# Patient Record
Sex: Female | Born: 1970 | Race: Black or African American | Hispanic: No | Marital: Single | Smoking: Current every day smoker
Health system: Southern US, Community
[De-identification: ages and names within clinical notes are randomized; demographics above are authoritative.]

## PROBLEM LIST (undated history)

## (undated) DIAGNOSIS — K2980 Duodenitis without bleeding: Secondary | ICD-10-CM

## (undated) DIAGNOSIS — E785 Hyperlipidemia, unspecified: Secondary | ICD-10-CM

## (undated) DIAGNOSIS — D75839 Thrombocytosis, unspecified: Secondary | ICD-10-CM

## (undated) DIAGNOSIS — D649 Anemia, unspecified: Secondary | ICD-10-CM

## (undated) DIAGNOSIS — D473 Essential (hemorrhagic) thrombocythemia: Secondary | ICD-10-CM

## (undated) DIAGNOSIS — N6009 Solitary cyst of unspecified breast: Secondary | ICD-10-CM

## (undated) DIAGNOSIS — K219 Gastro-esophageal reflux disease without esophagitis: Secondary | ICD-10-CM

## (undated) DIAGNOSIS — T884XXA Failed or difficult intubation, initial encounter: Secondary | ICD-10-CM

## (undated) DIAGNOSIS — H9192 Unspecified hearing loss, left ear: Secondary | ICD-10-CM

## (undated) DIAGNOSIS — R011 Cardiac murmur, unspecified: Secondary | ICD-10-CM

## (undated) DIAGNOSIS — D259 Leiomyoma of uterus, unspecified: Secondary | ICD-10-CM

## (undated) HISTORY — DX: Anemia, unspecified: D64.9

## (undated) HISTORY — DX: Gastro-esophageal reflux disease without esophagitis: K21.9

## (undated) HISTORY — PX: BACK SURGERY: SHX140

## (undated) HISTORY — DX: Solitary cyst of unspecified breast: N60.09

## (undated) HISTORY — PX: BREAST BIOPSY: SHX20

---

## 2004-07-10 ENCOUNTER — Emergency Department: Payer: Self-pay | Admitting: Emergency Medicine

## 2008-11-13 ENCOUNTER — Emergency Department: Payer: Self-pay | Admitting: Emergency Medicine

## 2011-07-30 LAB — HM PAP SMEAR: HM PAP: NORMAL

## 2015-03-06 ENCOUNTER — Ambulatory Visit: Payer: Self-pay | Admitting: Family Medicine

## 2015-03-21 ENCOUNTER — Ambulatory Visit: Payer: Self-pay | Admitting: Family Medicine

## 2015-05-19 ENCOUNTER — Ambulatory Visit: Payer: Self-pay | Admitting: Physician Assistant

## 2015-07-07 ENCOUNTER — Ambulatory Visit (INDEPENDENT_AMBULATORY_CARE_PROVIDER_SITE_OTHER): Payer: BLUE CROSS/BLUE SHIELD | Admitting: Family Medicine

## 2015-07-07 ENCOUNTER — Encounter: Payer: Self-pay | Admitting: Family Medicine

## 2015-07-07 VITALS — BP 132/90 | HR 93 | Temp 98.4°F | Resp 16 | Ht 61.0 in | Wt 125.6 lb

## 2015-07-07 DIAGNOSIS — Z23 Encounter for immunization: Secondary | ICD-10-CM

## 2015-07-07 DIAGNOSIS — N6002 Solitary cyst of left breast: Secondary | ICD-10-CM

## 2015-07-07 DIAGNOSIS — Z7189 Other specified counseling: Secondary | ICD-10-CM

## 2015-07-07 DIAGNOSIS — Z8249 Family history of ischemic heart disease and other diseases of the circulatory system: Secondary | ICD-10-CM | POA: Diagnosis not present

## 2015-07-07 DIAGNOSIS — Z7689 Persons encountering health services in other specified circumstances: Secondary | ICD-10-CM

## 2015-07-07 DIAGNOSIS — K219 Gastro-esophageal reflux disease without esophagitis: Secondary | ICD-10-CM | POA: Insufficient documentation

## 2015-07-07 MED ORDER — OMEPRAZOLE 20 MG PO CPDR: 20.0000 mg | DELAYED_RELEASE_CAPSULE | Freq: Every day | ORAL | Status: DC

## 2015-07-07 NOTE — Patient Instructions (Signed)
We will schedule your breast ultrasoun and mammogram. We will give you a call.

## 2015-07-07 NOTE — Assessment & Plan Note (Signed)
L breast cyst- ordered US and mammogram for evaluation due to lump getting larger.

## 2015-07-07 NOTE — Assessment & Plan Note (Addendum)
Appears well controlled. No alarm symptoms. Continue omeprazole.  Check CBC. RTC 6 mos.

## 2015-07-07 NOTE — Progress Notes (Signed)
Subjective:    Patient ID: Tasha Simmons, female    DOB: 1970/10/25, 44 y.o.   MRN: [id number]  HPI: Tasha Simmons is a 44 y.o. female presenting on 07/07/2015 for Establish Care   HPI  Pt presents to establish care today. Previous care provider was none.  It has been several  Years since her last PCP visit. Records from previous provider will be requested and reviewed. Current medical problems include:  Acid Reflux: Controlled with omeprazole. No regurg, dysphagia, or blood in vomit.  Health maintenance:  Last pap: 2013- normal.  Mammogram: Last 5 years ago. Cyst in L breast- feels as though it has gotten bigger. No pain in the L breast.  TDAP: Unsure > 10 years ago.    Employeed a Engineer, water at [employer].  2 pregnancies and 2 live births- age 78, 61 Exercise: Roller skate, bikes, and walking.  Diet: Regular diet- trying to cut back on fried food.    Past Medical History  Diagnosis Date  . Acid reflux   . Benign breast cyst in female     5 years ago.    Social History   Social History  . Marital Status: Single    Spouse Name: N/A  . Number of Children: N/A  . Years of Education: N/A   Occupational History  . Not on file.   Social History Main Topics  . Smoking status: Current Every Day Smoker -- 0.50 packs/day  . Smokeless tobacco: Not on file  . Alcohol Use: No  . Drug Use: No  . Sexual Activity: Not on file   Other Topics Concern  . Not on file   Social History Narrative  . No narrative on file   Family History  Problem Relation Age of Onset  . Hyperlipidemia Mother   . Heart disease Father   . Hyperlipidemia Father   . Heart disease Maternal Grandmother    No current outpatient prescriptions on file prior to visit.   No current facility-administered medications on file prior to visit.    Review of Systems  Constitutional: Negative for fever and chills.  HENT: Negative.   Respiratory: Negative for cough, chest tightness and wheezing.     Cardiovascular: Negative for chest pain and leg swelling.  Gastrointestinal: Negative for nausea, vomiting, abdominal pain, diarrhea and constipation.  Endocrine: Negative.  Negative for cold intolerance, heat intolerance, polydipsia, polyphagia and polyuria.  Genitourinary: Negative for dysuria and difficulty urinating.  Musculoskeletal: Negative.   Neurological: Negative for dizziness, light-headedness and numbness.  Psychiatric/Behavioral: Negative.    Per HPI unless specifically indicated above     Objective:    BP 132/90 mmHg  Pulse 93  Temp(Src) 98.4 F (36.9 C) (Oral)  Resp 16  Ht 5\' 1"  (1.549 m)  Wt 125 lb 9.6 oz (56.972 kg)  BMI 23.74 kg/m2  LMP 06/22/2015  Wt Readings from Last 3 Encounters:  07/07/15 125 lb 9.6 oz (56.972 kg)    Physical Exam  Constitutional: She is oriented to person, place, and time. She appears well-developed and well-nourished.  HENT:  Head: Normocephalic and atraumatic.  Neck: Neck supple.  Cardiovascular: Normal rate, regular rhythm and normal heart sounds.  Exam reveals no gallop and no friction rub.   No murmur heard. Pulmonary/Chest: Effort normal and breath sounds normal. She has no wheezes. She exhibits no tenderness. Right breast exhibits no inverted nipple, no mass, no nipple discharge and no tenderness. Left breast exhibits mass. Left breast exhibits no inverted nipple, no  skin change and no tenderness.  L breast lump above the nipple about 11o'clock  Abdominal: Soft. Normal appearance and bowel sounds are normal. She exhibits no distension and no mass. There is no tenderness. There is no rebound and no guarding.  Musculoskeletal: Normal range of motion. She exhibits no edema or tenderness.  Lymphadenopathy:    She has no cervical adenopathy.  Neurological: She is alert and oriented to person, place, and time.  Skin: Skin is warm and dry.   Results for orders placed or performed in visit on 07/07/15  HM PAP SMEAR  Result Value Ref  Range   HM Pap smear normal       Assessment & Plan:   Problem List Items Addressed This Visit      Digestive   Gastroesophageal reflux disease without esophagitis    Appears well controlled. No alarm symptoms. Continue omeprazole.  Check CBC. RTC 6 mos.       Relevant Medications   omeprazole (PRILOSEC) 20 MG capsule   Other Relevant Orders   Comprehensive Metabolic Panel (CMET)   CBC with Differential     Other   Breast cyst    L breast cyst- ordered US and mammogram for evaluation due to lump getting larger.       Relevant Orders   MM Digital Diagnostic Bilat   US BREAST LTD UNI LEFT INC AXILLA   US BREAST LTD UNI RIGHT INC AXILLA    Other Visit Diagnoses    Need for influenza vaccination    -  Primary    Relevant Orders    Flu Vaccine QUAD 36+ mos PF IM (Fluarix & Fluzone Quad PF) (Completed)    Encounter to establish care        Due for [facility] this year.     Family history of heart attack        Check lipid panel to stratify risk factors.     Relevant Orders    Lipid Profile    Need for Tdap vaccination        Relevant Orders    Tdap vaccine greater than or equal to 7yo IM (Completed)       Meds ordered this encounter  Medications  . DISCONTD: omeprazole (PRILOSEC) 20 MG capsule    Sig:   . omeprazole (PRILOSEC) 20 MG capsule    Sig: Take 1 capsule (20 mg total) by mouth daily.    Dispense:  30 capsule    Refill:  11    Order Specific Question:  Supervising Provider    Answer:  Arlis Porta [phone]      Follow up plan: Return in about 6 months (around 01/05/2016) for Acid reflux. Marland Kitchen

## 2015-07-14 LAB — CBC WITH DIFFERENTIAL/PLATELET
BASOS ABS: 0.1 10*3/uL (ref 0.0–0.2)
Basos: 1 %
EOS (ABSOLUTE): 0.1 10*3/uL (ref 0.0–0.4)
Eos: 1 %
Hematocrit: 29.8 % — ABNORMAL LOW (ref 34.0–46.6)
Hemoglobin: 9.2 g/dL — ABNORMAL LOW (ref 11.1–15.9)
IMMATURE GRANULOCYTES: 0 %
Immature Grans (Abs): 0 10*3/uL (ref 0.0–0.1)
Lymphocytes Absolute: 3.6 10*3/uL — ABNORMAL HIGH (ref 0.7–3.1)
Lymphs: 32 %
MCH: 20.7 pg — ABNORMAL LOW (ref 26.6–33.0)
MCHC: 30.9 g/dL — ABNORMAL LOW (ref 31.5–35.7)
MCV: 67 fL — AB (ref 79–97)
MONOS ABS: 0.9 10*3/uL (ref 0.1–0.9)
Monocytes: 8 %
NEUTROS PCT: 58 %
Neutrophils Absolute: 6.6 10*3/uL (ref 1.4–7.0)
PLATELETS: 587 10*3/uL — AB (ref 150–379)
RBC: 4.45 x10E6/uL (ref 3.77–5.28)
RDW: 17 % — AB (ref 12.3–15.4)
WBC: 11.3 10*3/uL — AB (ref 3.4–10.8)

## 2015-07-15 LAB — COMPREHENSIVE METABOLIC PANEL
A/G RATIO: 1.4 (ref 1.1–2.5)
ALBUMIN: 4.4 g/dL (ref 3.5–5.5)
ALT: 11 IU/L (ref 0–32)
AST: 18 IU/L (ref 0–40)
Alkaline Phosphatase: 56 IU/L (ref 39–117)
BUN / CREAT RATIO: 15 (ref 9–23)
BUN: 8 mg/dL (ref 6–24)
CHLORIDE: 102 mmol/L (ref 96–106)
CO2: 17 mmol/L — ABNORMAL LOW (ref 18–29)
Calcium: 8.9 mg/dL (ref 8.7–10.2)
Creatinine, Ser: 0.55 mg/dL — ABNORMAL LOW (ref 0.57–1.00)
GFR calc non Af Amer: 115 mL/min/{1.73_m2} (ref >59)
GFR, EST AFRICAN AMERICAN: 133 mL/min/{1.73_m2} (ref >59)
Globulin, Total: 3.2 g/dL (ref 1.5–4.5)
Glucose: 69 mg/dL (ref 65–99)
POTASSIUM: 4.3 mmol/L (ref 3.5–5.2)
Sodium: 139 mmol/L (ref 134–144)
TOTAL PROTEIN: 7.6 g/dL (ref 6.0–8.5)

## 2015-07-15 LAB — LIPID PANEL
Chol/HDL Ratio: 3.3 ratio units (ref 0.0–4.4)
Cholesterol, Total: 188 mg/dL (ref 100–199)
HDL: 57 mg/dL (ref >39)
LDL Calculated: 109 mg/dL — ABNORMAL HIGH (ref 0–99)
TRIGLYCERIDES: 111 mg/dL (ref 0–149)
VLDL Cholesterol Cal: 22 mg/dL (ref 5–40)

## 2015-07-17 ENCOUNTER — Telehealth: Payer: Self-pay | Admitting: Family Medicine

## 2015-07-17 ENCOUNTER — Other Ambulatory Visit: Payer: Self-pay | Admitting: Family Medicine

## 2015-07-17 DIAGNOSIS — D509 Iron deficiency anemia, unspecified: Secondary | ICD-10-CM

## 2015-07-17 LAB — IRON AND TIBC
IRON SATURATION: 2 % — AB (ref 15–55)
IRON: 10 ug/dL — AB (ref 27–159)
TIBC: 450 ug/dL (ref 250–450)
UIBC: 440 ug/dL — ABNORMAL HIGH (ref 131–425)

## 2015-07-17 LAB — SPECIMEN STATUS REPORT

## 2015-07-17 LAB — FERRITIN: FERRITIN: 7 ng/mL — AB (ref 15–150)

## 2015-07-17 NOTE — Telephone Encounter (Signed)
Called pt to ensure she saw my chart message about lab results. Pt will call back if she has questions.

## 2015-07-25 ENCOUNTER — Other Ambulatory Visit: Payer: Self-pay

## 2015-07-25 ENCOUNTER — Ambulatory Visit: Payer: Self-pay

## 2015-09-01 ENCOUNTER — Ambulatory Visit
Admission: RE | Admit: 2015-09-01 | Discharge: 2015-09-01 | Disposition: A | Discharge status: Home / Self Care | Payer: BLUE CROSS/BLUE SHIELD | Source: Ambulatory Visit | Attending: Family Medicine | Admitting: Family Medicine

## 2015-09-01 ENCOUNTER — Other Ambulatory Visit: Payer: Self-pay | Admitting: Family Medicine

## 2015-09-01 DIAGNOSIS — R921 Mammographic calcification found on diagnostic imaging of breast: Secondary | ICD-10-CM | POA: Diagnosis not present

## 2015-09-01 DIAGNOSIS — N63 Unspecified lump in breast: Secondary | ICD-10-CM | POA: Diagnosis not present

## 2015-09-01 DIAGNOSIS — N6002 Solitary cyst of left breast: Secondary | ICD-10-CM

## 2016-02-12 ENCOUNTER — Ambulatory Visit (INDEPENDENT_AMBULATORY_CARE_PROVIDER_SITE_OTHER): Payer: BLUE CROSS/BLUE SHIELD | Admitting: Family Medicine

## 2016-02-12 ENCOUNTER — Encounter: Payer: Self-pay | Admitting: Family Medicine

## 2016-02-12 VITALS — BP 127/82 | HR 76 | Temp 98.4°F | Resp 16 | Ht 61.0 in | Wt 115.0 lb

## 2016-02-12 DIAGNOSIS — D509 Iron deficiency anemia, unspecified: Secondary | ICD-10-CM | POA: Diagnosis not present

## 2016-02-12 DIAGNOSIS — Z0000 Encounter for general adult medical examination without abnormal findings: Secondary | ICD-10-CM

## 2016-02-12 DIAGNOSIS — N6002 Solitary cyst of left breast: Secondary | ICD-10-CM | POA: Diagnosis not present

## 2016-02-12 DIAGNOSIS — K219 Gastro-esophageal reflux disease without esophagitis: Secondary | ICD-10-CM | POA: Diagnosis not present

## 2016-02-12 DIAGNOSIS — R6881 Early satiety: Secondary | ICD-10-CM

## 2016-02-12 DIAGNOSIS — N63 Unspecified lump in unspecified breast: Secondary | ICD-10-CM | POA: Insufficient documentation

## 2016-02-12 LAB — CBC WITH DIFFERENTIAL/PLATELET
BASOS PCT: 1 %
Basophils Absolute: 90 cells/uL (ref 0–200)
EOS PCT: 2 %
Eosinophils Absolute: 180 cells/uL (ref 15–500)
HCT: 41.3 % (ref 35.0–45.0)
Hemoglobin: 13.5 g/dL (ref 11.7–15.5)
LYMPHS PCT: 30 %
Lymphs Abs: 2700 cells/uL (ref 850–3900)
MCH: 29.3 pg (ref 27.0–33.0)
MCHC: 32.7 g/dL (ref 32.0–36.0)
MCV: 89.6 fL (ref 80.0–100.0)
MONOS PCT: 8 %
MPV: 8.9 fL (ref 7.5–12.5)
Monocytes Absolute: 720 cells/uL (ref 200–950)
NEUTROS ABS: 5310 {cells}/uL (ref 1500–7800)
Neutrophils Relative %: 59 %
PLATELETS: 497 10*3/uL — AB (ref 140–400)
RBC: 4.61 MIL/uL (ref 3.80–5.10)
RDW: 13.5 % (ref 11.0–15.0)
WBC: 9 10*3/uL (ref 3.8–10.8)

## 2016-02-12 LAB — FERRITIN: Ferritin: 25 ng/mL (ref 10–232)

## 2016-02-12 LAB — RETICULOCYTES
ABS RETIC: 55320 {cells}/uL (ref 20000–80000)
RBC.: 4.61 MIL/uL (ref 3.80–5.10)
Retic Ct Pct: 1.2 %

## 2016-02-12 MED ORDER — RANITIDINE HCL 150 MG PO CAPS: 150.0000 mg | ORAL_CAPSULE | Freq: Two times a day (BID) | ORAL | Status: DC

## 2016-02-12 NOTE — Assessment & Plan Note (Addendum)
Discussed long-term PPI with patient. Due to daily symptoms will try an H2 blocker BID to help with symptoms. Recheck 1 mos. Plan for GI referral with continued symptoms.

## 2016-02-12 NOTE — Assessment & Plan Note (Signed)
Needs 6 mos mammogram and L breast US ordered.

## 2016-02-12 NOTE — Assessment & Plan Note (Signed)
Recheck iron studies. Plan for hematology referral to discuss infusions if iron remains low.

## 2016-02-12 NOTE — Patient Instructions (Signed)
Health Maintenance, Female Adopting a healthy lifestyle and getting preventive care can go a long way to promote health and wellness. Talk with your health care provider about what schedule of regular examinations is right for you. This is a good chance for you to check in with your provider about disease prevention and staying healthy. In between checkups, there are plenty of things you can do on your own. Experts have done a lot of research about which lifestyle changes and preventive measures are most likely to keep you healthy. Ask your health care provider for more information. WEIGHT AND DIET  Eat a healthy diet  Be sure to include plenty of vegetables, fruits, low-fat dairy products, and lean protein.  Do not eat a lot of foods high in solid fats, added sugars, or salt.  Get regular exercise. This is one of the most important things you can do for your health.  Most adults should exercise for at least 150 minutes each week. The exercise should increase your heart rate and make you sweat (moderate-intensity exercise).  Most adults should also do strengthening exercises at least twice a week. This is in addition to the moderate-intensity exercise.  Maintain a healthy weight  Body mass index (BMI) is a measurement that can be used to identify possible weight problems. It estimates body fat based on height and weight. Your health care provider can help determine your BMI and help you achieve or maintain a healthy weight.  For females 20 years of age and older:   A BMI below 18.5 is considered underweight.  A BMI of 18.5 to 24.9 is normal.  A BMI of 25 to 29.9 is considered overweight.  A BMI of 30 and above is considered obese.  Watch levels of cholesterol and blood lipids  You should start having your blood tested for lipids and cholesterol at 44 years of age, then have this test every 5 years.  You may need to have your cholesterol levels checked more often if:  Your lipid  or cholesterol levels are high.  You are older than 44 years of age.  You are at high risk for heart disease.  CANCER SCREENING   Lung Cancer  Lung cancer screening is recommended for adults 55-80 years old who are at high risk for lung cancer because of a history of smoking.  A yearly low-dose CT scan of the lungs is recommended for people who:  Currently smoke.  Have quit within the past 15 years.  Have at least a 30-pack-year history of smoking. A pack year is smoking an average of one pack of cigarettes a day for 1 year.  Yearly screening should continue until it has been 15 years since you quit.  Yearly screening should stop if you develop a health problem that would prevent you from having lung cancer treatment.  Breast Cancer  Practice breast self-awareness. This means understanding how your breasts normally appear and feel.  It also means doing regular breast self-exams. Let your health care provider know about any changes, no matter how small.  If you are in your 20s or 30s, you should have a clinical breast exam (CBE) by a health care provider every 1-3 years as part of a regular health exam.  If you are 40 or older, have a CBE every year. Also consider having a breast X-ray (mammogram) every year.  If you have a family history of breast cancer, talk to your health care provider about genetic screening.  If you   are at high risk for breast cancer, talk to your health care provider about having an MRI and a mammogram every year.  Breast cancer gene (BRCA) assessment is recommended for women who have family members with BRCA-related cancers. BRCA-related cancers include:  Breast.  Ovarian.  Tubal.  Peritoneal cancers.  Results of the assessment will determine the need for genetic counseling and BRCA1 and BRCA2 testing. Cervical Cancer Your health care provider may recommend that you be screened regularly for cancer of the pelvic organs (ovaries, uterus, and  vagina). This screening involves a pelvic examination, including checking for microscopic changes to the surface of your cervix (Pap test). You may be encouraged to have this screening done every 3 years, beginning at age 21.  For women ages 30-65, health care providers may recommend pelvic exams and Pap testing every 3 years, or they may recommend the Pap and pelvic exam, combined with testing for human papilloma virus (HPV), every 5 years. Some types of HPV increase your risk of cervical cancer. Testing for HPV may also be done on women of any age with unclear Pap test results.  Other health care providers may not recommend any screening for nonpregnant women who are considered low risk for pelvic cancer and who do not have symptoms. Ask your health care provider if a screening pelvic exam is right for you.  If you have had past treatment for cervical cancer or a condition that could lead to cancer, you need Pap tests and screening for cancer for at least 20 years after your treatment. If Pap tests have been discontinued, your risk factors (such as having a new sexual partner) need to be reassessed to determine if screening should resume. Some women have medical problems that increase the chance of getting cervical cancer. In these cases, your health care provider may recommend more frequent screening and Pap tests. Colorectal Cancer  This type of cancer can be detected and often prevented.  Routine colorectal cancer screening usually begins at 44 years of age and continues through 44 years of age.  Your health care provider may recommend screening at an earlier age if you have risk factors for colon cancer.  Your health care provider may also recommend using home test kits to check for hidden blood in the stool.  A small camera at the end of a tube can be used to examine your colon directly (sigmoidoscopy or colonoscopy). This is done to check for the earliest forms of colorectal  cancer.  Routine screening usually begins at age 50.  Direct examination of the colon should be repeated every 5-10 years through 44 years of age. However, you may need to be screened more often if early forms of precancerous polyps or small growths are found. Skin Cancer  Check your skin from head to toe regularly.  Tell your health care provider about any new moles or changes in moles, especially if there is a change in a mole's shape or color.  Also tell your health care provider if you have a mole that is larger than the size of a pencil eraser.  Always use sunscreen. Apply sunscreen liberally and repeatedly throughout the day.  Protect yourself by wearing long sleeves, pants, a wide-brimmed hat, and sunglasses whenever you are outside. HEART DISEASE, DIABETES, AND HIGH BLOOD PRESSURE   High blood pressure causes heart disease and increases the risk of stroke. High blood pressure is more likely to develop in:  People who have blood pressure in the high end   of the normal range (130-139/85-89 mm Hg).  People who are overweight or obese.  People who are African American.  If you are 38-23 years of age, have your blood pressure checked every 3-5 years. If you are 61 years of age or older, have your blood pressure checked every year. You should have your blood pressure measured twice--once when you are at a hospital or clinic, and once when you are not at a hospital or clinic. Record the average of the two measurements. To check your blood pressure when you are not at a hospital or clinic, you can use:  An automated blood pressure machine at a pharmacy.  A home blood pressure monitor.  If you are between 45 years and 39 years old, ask your health care provider if you should take aspirin to prevent strokes.  Have regular diabetes screenings. This involves taking a blood sample to check your fasting blood sugar level.  If you are at a normal weight and have a low risk for diabetes,  have this test once every three years after 44 years of age.  If you are overweight and have a high risk for diabetes, consider being tested at a younger age or more often. PREVENTING INFECTION  Hepatitis B  If you have a higher risk for hepatitis B, you should be screened for this virus. You are considered at high risk for hepatitis B if:  You were born in a country where hepatitis B is common. Ask your health care provider which countries are considered high risk.  Your parents were born in a high-risk country, and you have not been immunized against hepatitis B (hepatitis B vaccine).  You have HIV or AIDS.  You use needles to inject street drugs.  You live with someone who has hepatitis B.  You have had sex with someone who has hepatitis B.  You get hemodialysis treatment.  You take certain medicines for conditions, including cancer, organ transplantation, and autoimmune conditions. Hepatitis C  Blood testing is recommended for:  Everyone born from [birth year] through 1964.  Anyone with known risk factors for hepatitis C. Sexually transmitted infections (STIs)  You should be screened for sexually transmitted infections (STIs) including gonorrhea and chlamydia if:  You are sexually active and are younger than 44 years of age.  You are older than 44 years of age and your health care provider tells you that you are at risk for this type of infection.  Your sexual activity has changed since you were last screened and you are at an increased risk for chlamydia or gonorrhea. Ask your health care provider if you are at risk.  If you do not have HIV, but are at risk, it may be recommended that you take a prescription medicine daily to prevent HIV infection. This is called pre-exposure prophylaxis (PrEP). You are considered at risk if:  You are sexually active and do not regularly use condoms or know the HIV status of your partner(s).  You take drugs by injection.  You are sexually  active with a partner who has HIV. Talk with your health care provider about whether you are at high risk of being infected with HIV. If you choose to begin PrEP, you should first be tested for HIV. You should then be tested every 3 months for as long as you are taking PrEP.  PREGNANCY   If you are premenopausal and you may become pregnant, ask your health care provider about preconception counseling.  If you may  become pregnant, take 400 to 800 micrograms (mcg) of folic acid every day.  If you want to prevent pregnancy, talk to your health care provider about birth control (contraception). OSTEOPOROSIS AND MENOPAUSE   Osteoporosis is a disease in which the bones lose minerals and strength with aging. This can result in serious bone fractures. Your risk for osteoporosis can be identified using a bone density scan.  If you are 61 years of age or older, or if you are at risk for osteoporosis and fractures, ask your health care provider if you should be screened.  Ask your health care provider whether you should take a calcium or vitamin D supplement to lower your risk for osteoporosis.  Menopause may have certain physical symptoms and risks.  Hormone replacement therapy may reduce some of these symptoms and risks. Talk to your health care provider about whether hormone replacement therapy is right for you.  HOME CARE INSTRUCTIONS   Schedule regular health, dental, and eye exams.  Stay current with your immunizations.   Do not use any tobacco products including cigarettes, chewing tobacco, or electronic cigarettes.  If you are pregnant, do not drink alcohol.  If you are breastfeeding, limit how much and how often you drink alcohol.  Limit alcohol intake to no more than 1 drink per day for nonpregnant women. One drink equals 12 ounces of beer, 5 ounces of wine, or 1 ounces of hard liquor.  Do not use street drugs.  Do not share needles.  Ask your health care provider for help if  you need support or information about quitting drugs.  Tell your health care provider if you often feel depressed.  Tell your health care provider if you have ever been abused or do not feel safe at home.   This information is not intended to replace advice given to you by your health care provider. Make sure you discuss any questions you have with your health care provider.   Document Released: 01/28/2011 Document Revised: 08/05/2014 Document Reviewed: 06/16/2013 Elsevier Interactive Patient Education [employer].

## 2016-02-12 NOTE — Progress Notes (Signed)
Subjective:    Patient ID: Tasha Simmons, female    DOB: [birth year], 45 y.o.   MRN: [id number]  HPI: Tasha Simmons is a 45 y.o. female presenting on 02/12/2016 for Annual Exam   HPI  Pt presents for CPE. She in her menstrual cycle today and would like to decline pap smear and bimanual exam. Period started last Wednesday (7/12). Currently spotting.  Had mammo- needs 6 mos follow-up for L breast benign mass.  GERD- not taking prilosec every day. Takes every 2 weeks or so. Has cut back on eating- feels like the food sits on her stomach. Early fullness. Started when she stopped taking prilosec. No blood in stool or vomit. No regurg. No dysphagia.  Iron deficiency- taking iron once daily. Tolerating well.    Past Medical History  Diagnosis Date  . Acid reflux   . Benign breast cyst in female     5 years ago.     Current Outpatient Prescriptions on File Prior to Visit  Medication Sig  . omeprazole (PRILOSEC) 20 MG capsule Take 1 capsule (20 mg total) by mouth daily.   No current facility-administered medications on file prior to visit.    Review of Systems  Constitutional: Negative for fever and chills.  HENT: Negative.   Respiratory: Negative for cough, chest tightness and wheezing.   Cardiovascular: Negative for chest pain and leg swelling.  Gastrointestinal: Negative for nausea, vomiting, abdominal pain, diarrhea and constipation.  Endocrine: Negative.  Negative for cold intolerance, heat intolerance, polydipsia, polyphagia and polyuria.  Genitourinary: Negative for dysuria and difficulty urinating.  Musculoskeletal: Negative.   Neurological: Negative for dizziness, light-headedness and numbness.  Psychiatric/Behavioral: Negative.  Negative for suicidal ideas and dysphoric mood. The patient is not nervous/anxious.    Per HPI unless specifically indicated above     Objective:    BP 127/82 mmHg  Pulse 76  Temp(Src) 98.4 F (36.9 C) (Oral)  Resp 16  Ht 5\' 1"  (1.549 m)   Wt 115 lb (52.164 kg)  BMI 21.74 kg/m2  LMP 02/07/2016  Wt Readings from Last 3 Encounters:  02/12/16 115 lb (52.164 kg)  07/07/15 125 lb 9.6 oz (56.972 kg)    Depression screen [facility] 2/9 02/12/2016 07/07/2015  Decreased Interest 0 0  Down, Depressed, Hopeless 0 0  PHQ - 2 Score 0 0     Physical Exam  Constitutional: She is oriented to person, place, and time. She appears well-developed and well-nourished.  HENT:  Head: Normocephalic and atraumatic.  Neck: Neck supple.  Cardiovascular: Normal rate, regular rhythm and normal heart sounds.  Exam reveals no gallop and no friction rub.   No murmur heard. Pulmonary/Chest: Effort normal and breath sounds normal. She has no wheezes. She exhibits no tenderness.  Abdominal: Soft. Normal appearance and bowel sounds are normal. She exhibits no distension and no mass. There is no tenderness. There is no rebound and no guarding.  Musculoskeletal: Normal range of motion. She exhibits no edema or tenderness.  Lymphadenopathy:    She has no cervical adenopathy.  Neurological: She is alert and oriented to person, place, and time.  Skin: Skin is warm and dry.  Psychiatric: She has a normal mood and affect. Her behavior is normal. Judgment and thought content normal.   Results for orders placed or performed in visit on 07/07/15  HM PAP SMEAR  Result Value Ref Range   HM Pap smear normal   Lipid Profile  Result Value Ref Range   Cholesterol, Total  188 100 - 199 mg/dL   Triglycerides 111 0 - 149 mg/dL   HDL 57 >39 mg/dL   VLDL Cholesterol Cal 22 5 - 40 mg/dL   LDL Calculated 109 (H) 0 - 99 mg/dL   Chol/HDL Ratio 3.3 0.0 - 4.4 ratio units  Comprehensive Metabolic Panel (CMET)  Result Value Ref Range   Glucose 69 65 - 99 mg/dL   BUN 8 6 - 24 mg/dL   Creatinine, Ser 0.55 (L) 0.57 - 1.00 mg/dL   GFR calc non Af Amer 115 >59 mL/min/1.73   GFR calc Af Amer 133 >59 mL/min/1.73   BUN/Creatinine Ratio 15 9 - 23   Sodium 139 134 - 144 mmol/L    Potassium 4.3 3.5 - 5.2 mmol/L   Chloride 102 96 - 106 mmol/L   CO2 17 (L) 18 - 29 mmol/L   Calcium 8.9 8.7 - 10.2 mg/dL   Total Protein 7.6 6.0 - 8.5 g/dL   Albumin 4.4 3.5 - 5.5 g/dL   Globulin, Total 3.2 1.5 - 4.5 g/dL   Albumin/Globulin Ratio 1.4 1.1 - 2.5   Bilirubin Total <0.2 0.0 - 1.2 mg/dL   Alkaline Phosphatase 56 39 - 117 IU/L   AST 18 0 - 40 IU/L   ALT 11 0 - 32 IU/L  CBC with Differential  Result Value Ref Range   WBC 11.3 (H) 3.4 - 10.8 x10E3/uL   RBC 4.45 3.77 - 5.28 x10E6/uL   Hemoglobin 9.2 (L) 11.1 - 15.9 g/dL   Hematocrit 29.8 (L) 34.0 - 46.6 %   MCV 67 (L) 79 - 97 fL   MCH 20.7 (L) 26.6 - 33.0 pg   MCHC 30.9 (L) 31.5 - 35.7 g/dL   RDW 17.0 (H) 12.3 - 15.4 %   Platelets 587 (H) 150 - 379 x10E3/uL   Neutrophils 58 %   Lymphs 32 %   Monocytes 8 %   Eos 1 %   Basos 1 %   Neutrophils Absolute 6.6 1.4 - 7.0 x10E3/uL   Lymphocytes Absolute 3.6 (H) 0.7 - 3.1 x10E3/uL   Monocytes Absolute 0.9 0.1 - 0.9 x10E3/uL   EOS (ABSOLUTE) 0.1 0.0 - 0.4 x10E3/uL   Basophils Absolute 0.1 0.0 - 0.2 x10E3/uL   Immature Granulocytes 0 %   Immature Grans (Abs) 0.0 0.0 - 0.1 x10E3/uL  Iron and TIBC  Result Value Ref Range   Total Iron Binding Capacity 450 250 - 450 ug/dL   UIBC 440 (H) 131 - 425 ug/dL   Iron 10 (L) 27 - 159 ug/dL   Iron Saturation 2 (LL) 15 - 55 %  Ferritin  Result Value Ref Range   Ferritin 7 (L) 15 - 150 ng/mL  Specimen status report  Result Value Ref Range   specimen status report Comment       Assessment & Plan:   Problem List Items Addressed This Visit      Digestive   Gastroesophageal reflux disease without esophagitis    Discussed long-term PPI with patient. Due to daily symptoms will try an H2 blocker BID to help with symptoms. Recheck 1 mos. Plan for GI referral with continued symptoms.        Relevant Medications   ranitidine (ZANTAC) 150 MG capsule     Other   Breast cyst    Needs 6 mos mammogram and L breast US ordered.        Relevant Orders   US BREAST LTD UNI LEFT INC AXILLA   Iron deficiency anemia  Recheck iron studies. Plan for hematology referral to discuss infusions if iron remains low.       Relevant Medications   ferrous sulfate 325 (65 FE) MG tablet   Other Relevant Orders   CBC with Differential/Platelet   Iron Binding Cap (TIBC)   Ferritin   Retic    Other Visit Diagnoses    Well woman exam without gynecological exam    -  Primary    Relevant Orders    BASIC METABOLIC PANEL WITH GFR    Early satiety        Relevant Orders    Amylase    Lipase       Meds ordered this encounter  Medications  . ferrous sulfate 325 (65 FE) MG tablet    Sig: Take 325 mg by mouth 2 (two) times daily with a meal.  . ranitidine (ZANTAC) 150 MG capsule    Sig: Take 1 capsule (150 mg total) by mouth 2 (two) times daily.    Dispense:  60 capsule    Refill:  11    Order Specific Question:  Supervising Provider    Answer:  Arlis Porta [phone]      Follow up plan: Return in about 4 weeks (around 03/11/2016) for GERD and pap smear. 3-5 weeks. Marland Kitchen

## 2016-02-13 LAB — IRON AND TIBC
%SAT: 62 % — AB (ref 11–50)
IRON: 212 ug/dL — AB (ref 40–190)
TIBC: 344 ug/dL (ref 250–450)
UIBC: 132 ug/dL (ref 125–400)

## 2016-02-13 LAB — BASIC METABOLIC PANEL WITH GFR
BUN: 7 mg/dL (ref 7–25)
CHLORIDE: 107 mmol/L (ref 98–110)
CO2: 23 mmol/L (ref 20–31)
Calcium: 9.4 mg/dL (ref 8.6–10.2)
Creat: 0.75 mg/dL (ref 0.50–1.10)
Glucose, Bld: 82 mg/dL (ref 65–99)
POTASSIUM: 4.5 mmol/L (ref 3.5–5.3)
Sodium: 138 mmol/L (ref 135–146)

## 2016-02-13 LAB — LIPASE: LIPASE: 14 U/L (ref 7–60)

## 2016-02-13 LAB — AMYLASE: AMYLASE: 25 U/L (ref 0–105)

## 2016-03-12 ENCOUNTER — Ambulatory Visit: Payer: BLUE CROSS/BLUE SHIELD | Admitting: Family Medicine

## 2016-04-08 ENCOUNTER — Other Ambulatory Visit: Payer: Self-pay | Admitting: Family Medicine

## 2016-04-08 ENCOUNTER — Telehealth: Payer: Self-pay | Admitting: Gastroenterology

## 2016-04-08 ENCOUNTER — Encounter: Payer: Self-pay | Admitting: Family Medicine

## 2016-04-08 ENCOUNTER — Ambulatory Visit (INDEPENDENT_AMBULATORY_CARE_PROVIDER_SITE_OTHER): Payer: BLUE CROSS/BLUE SHIELD | Admitting: Family Medicine

## 2016-04-08 VITALS — BP 124/85 | HR 86 | Temp 98.1°F | Resp 16 | Ht 61.0 in | Wt 113.6 lb

## 2016-04-08 DIAGNOSIS — Z23 Encounter for immunization: Secondary | ICD-10-CM

## 2016-04-08 DIAGNOSIS — Z124 Encounter for screening for malignant neoplasm of cervix: Secondary | ICD-10-CM | POA: Diagnosis not present

## 2016-04-08 DIAGNOSIS — R87810 Cervical high risk human papillomavirus (HPV) DNA test positive: Secondary | ICD-10-CM | POA: Diagnosis not present

## 2016-04-08 DIAGNOSIS — R634 Abnormal weight loss: Secondary | ICD-10-CM | POA: Diagnosis not present

## 2016-04-08 DIAGNOSIS — K219 Gastro-esophageal reflux disease without esophagitis: Secondary | ICD-10-CM

## 2016-04-08 LAB — CBC WITH DIFFERENTIAL/PLATELET
BASOS ABS: 95 {cells}/uL (ref 0–200)
BASOS PCT: 1 %
EOS PCT: 2 %
Eosinophils Absolute: 190 cells/uL (ref 15–500)
HCT: 40.7 % (ref 35.0–45.0)
HEMOGLOBIN: 13.4 g/dL (ref 11.7–15.5)
LYMPHS ABS: 2755 {cells}/uL (ref 850–3900)
Lymphocytes Relative: 29 %
MCH: 29.1 pg (ref 27.0–33.0)
MCHC: 32.9 g/dL (ref 32.0–36.0)
MCV: 88.3 fL (ref 80.0–100.0)
MONOS PCT: 8 %
MPV: 9.1 fL (ref 7.5–12.5)
Monocytes Absolute: 760 cells/uL (ref 200–950)
NEUTROS ABS: 5700 {cells}/uL (ref 1500–7800)
Neutrophils Relative %: 60 %
PLATELETS: 532 10*3/uL — AB (ref 140–400)
RBC: 4.61 MIL/uL (ref 3.80–5.10)
RDW: 13.5 % (ref 11.0–15.0)
WBC: 9.5 10*3/uL (ref 3.8–10.8)

## 2016-04-08 LAB — BASIC METABOLIC PANEL WITH GFR
BUN: 5 mg/dL — AB (ref 7–25)
CHLORIDE: 105 mmol/L (ref 98–110)
CO2: 25 mmol/L (ref 20–31)
Calcium: 9.3 mg/dL (ref 8.6–10.2)
Creat: 0.59 mg/dL (ref 0.50–1.10)
GFR, Est African American: >89 mL/min (ref >=60)
GFR, Est Non African American: >89 mL/min (ref >=60)
Glucose, Bld: 82 mg/dL (ref 65–99)
POTASSIUM: 4.4 mmol/L (ref 3.5–5.3)
Sodium: 136 mmol/L (ref 135–146)

## 2016-04-08 MED ORDER — PANTOPRAZOLE SODIUM 40 MG PO TBEC: 40.0000 mg | DELAYED_RELEASE_TABLET | Freq: Every day | ORAL | 11 refills | Status: DC

## 2016-04-08 NOTE — Telephone Encounter (Signed)
Patient is returning your call regarding an appointment. EGD?

## 2016-04-08 NOTE — Telephone Encounter (Signed)
Patient wants to hold off on the procedure until she comes in for her appointment

## 2016-04-08 NOTE — Telephone Encounter (Signed)
Patient has been referred from [facility] to GI for GERD and weight loss. Inability to tolerate Omeprazole and difficulty eating due to GERD. She has tried Omeprazole and Ranitidine with no success. I have made her an appointment with Dr Allen Norris on Tuesday October 17th. Could you please triage to see if patient is a candidate for EGD prior to appointment? Thank you.

## 2016-04-08 NOTE — Progress Notes (Signed)
Subjective:    Patient ID: Tasha Simmons, female    DOB: 07-31-1970, 45 y.o.   MRN: [id number]  HPI: Tasha Simmons is a 45 y.o. female presenting on 04/08/2016 for Gastroesophageal Reflux (pt was RX Renitidine not helping)   HPI  Pt presents for follow-up of GERD. When she takes omeprazole- she feels like it makes her heart race. Has been taking it every other. Ranitidine works for a few hours but not quite. When it leaves her system makes her feel bloated. Has cut down on what she was eating due to bloating and feel uncomfortable. She has lost 8lbs since December.  Needs pap today. Unable to do at her [facility] due to period.  LMP 9/1.   Past Medical History:  Diagnosis Date  . Acid reflux   . Benign breast cyst in female    5 years ago.     Current Outpatient Prescriptions on File Prior to Visit  Medication Sig  . ranitidine (ZANTAC) 150 MG capsule Take 1 capsule (150 mg total) by mouth 2 (two) times daily.   No current facility-administered medications on file prior to visit.     Review of Systems Per HPI unless specifically indicated above     Objective:    BP 124/85 (BP Location: Right Arm, Patient Position: Sitting, Cuff Size: Normal)   Pulse 86   Temp 98.1 F (36.7 C) (Oral)   Resp 16   Ht '5\' 1"'$  (1.549 m)   Wt 113 lb 9.6 oz (51.5 kg)   LMP 02/27/2016   BMI 21.46 kg/m   Wt Readings from Last 3 Encounters:  04/08/16 113 lb 9.6 oz (51.5 kg)  02/12/16 115 lb (52.2 kg)  07/07/15 125 lb 9.6 oz (57 kg)    Physical Exam  Constitutional: She is oriented to person, place, and time. She appears well-developed and well-nourished.  HENT:  Head: Normocephalic and atraumatic.  Neck: Neck supple.  Cardiovascular: Normal rate, regular rhythm and normal heart sounds.  Exam reveals no gallop and no friction rub.   No murmur heard. Pulmonary/Chest: Effort normal and breath sounds normal. She has no wheezes. She exhibits no tenderness.  Abdominal: Soft. Normal appearance  and bowel sounds are normal. She exhibits no distension and no mass. There is tenderness in the epigastric area. There is no rebound and no guarding.  Genitourinary: Vagina normal and uterus normal. There is no tenderness, lesion or injury on the right labia. There is no tenderness, lesion or injury on the left labia. Uterus is not tender. Cervix exhibits no motion tenderness, no discharge and no friability. Right adnexum displays no mass, no tenderness and no fullness. Left adnexum displays no mass, no tenderness and no fullness. No erythema, tenderness or bleeding in the vagina.  Musculoskeletal: Normal range of motion. She exhibits no edema or tenderness.  Lymphadenopathy:    She has no cervical adenopathy.  Neurological: She is alert and oriented to person, place, and time.  Skin: Skin is warm and dry.   Results for orders placed or performed in visit on 02/12/16  CBC with Differential/Platelet  Result Value Ref Range   WBC 9.0 3.8 - 10.8 K/uL   RBC 4.61 3.80 - 5.10 MIL/uL   Hemoglobin 13.5 11.7 - 15.5 g/dL   HCT 41.3 35.0 - 45.0 %   MCV 89.6 80.0 - 100.0 fL   MCH 29.3 27.0 - 33.0 pg   MCHC 32.7 32.0 - 36.0 g/dL   RDW 13.5 11.0 - 15.0 %  Platelets 497 (H) 140 - 400 K/uL   MPV 8.9 7.5 - 12.5 fL   Neutro Abs 5,310 1,500 - 7,800 cells/uL   Lymphs Abs 2,700 850 - 3,900 cells/uL   Monocytes Absolute 720 200 - 950 cells/uL   Eosinophils Absolute 180 15 - 500 cells/uL   Basophils Absolute 90 0 - 200 cells/uL   Neutrophils Relative % 59 %   Lymphocytes Relative 30 %   Monocytes Relative 8 %   Eosinophils Relative 2 %   Basophils Relative 1 %   Smear Review Criteria for review not met   Iron Binding Cap (TIBC)  Result Value Ref Range   Iron 212 (H) 40 - 190 ug/dL   UIBC 132 125 - 400 ug/dL   TIBC 344 250 - 450 ug/dL   %SAT 62 (H) 11 - 50 %  Ferritin  Result Value Ref Range   Ferritin 25 10 - 232 ng/mL  Retic  Result Value Ref Range   Retic Ct Pct 1.2 %   RBC. 4.61 3.80 - 5.10  MIL/uL   ABS Retic 55,320 20,000 - 80,000 cells/uL  BASIC METABOLIC PANEL WITH GFR  Result Value Ref Range   Sodium 138 135 - 146 mmol/L   Potassium 4.5 3.5 - 5.3 mmol/L   Chloride 107 98 - 110 mmol/L   CO2 23 20 - 31 mmol/L   Glucose, Bld 82 65 - 99 mg/dL   BUN 7 7 - 25 mg/dL   Creat 0.75 0.50 - 1.10 mg/dL   Calcium 9.4 8.6 - 10.2 mg/dL   GFR, Est African American >89 >=60 mL/min   GFR, Est Non African American >89 >=60 mL/min  Amylase  Result Value Ref Range   Amylase 25 0 - 105 U/L  Lipase  Result Value Ref Range   Lipase 14 7 - 60 U/L      Assessment & Plan:   Problem List Items Addressed This Visit      Digestive   Gastroesophageal reflux disease without esophagitis - Primary    Refer to GI due to inability to tolerate omeprazole and difficulty eating due to GERD. Trial of protonix to see if she can tolerate.       Relevant Medications   pantoprazole (PROTONIX) 40 MG tablet   Other Relevant Orders   CBC with Differential   BASIC METABOLIC PANEL WITH GFR   Ambulatory referral to General Surgery    Other Visit Diagnoses    Screening for cervical cancer       Obtain pap today since unable to do at her CPE 2/2 menstrual cycle.    Relevant Orders   Pap,SurePath with HPV   Weight loss       Due to diet changes and difficulty eating from GERD. Refer to GI for evaluation.    Relevant Orders   Ambulatory referral to General Surgery   Need for influenza vaccination       Relevant Orders   Flu Vaccine QUAD 36+ mos PF IM (Fluarix & Fluzone Quad PF) (Completed)      Meds ordered this encounter  Medications  . pantoprazole (PROTONIX) 40 MG tablet    Sig: Take 1 tablet (40 mg total) by mouth daily.    Dispense:  30 tablet    Refill:  11    Order Specific Question:   Supervising Provider    Answer:   Arlis Porta [phone]      Follow up plan: Return in about 6 months (around  10/06/2016) for Iron def anemia.

## 2016-04-08 NOTE — Assessment & Plan Note (Addendum)
Refer to GI due to inability to tolerate omeprazole and difficulty eating due to GERD. Trial of protonix to see if she can tolerate side effects. GI referral pending.

## 2016-04-08 NOTE — Patient Instructions (Addendum)
We will have you seen by Dr. Allen Norris to have your acid reflux symptoms evaluated. Change to Protonix 40mg  once daily to help with GERD symptoms.   We will do labwork today to check on your anemia.

## 2016-04-08 NOTE — Telephone Encounter (Signed)
Called patient back and LVM

## 2016-04-08 NOTE — Telephone Encounter (Signed)
I can schedule Tasha Simmons for and EGD prior to her appointment on 05/14/16 for GERD and weight loss. Called patient and LVM

## 2016-04-08 NOTE — Telephone Encounter (Signed)
Call after 4:00 [phone]

## 2016-04-10 LAB — PAP,SUREPATH WITH HPV: HPV DNA HIGH RISK: DETECTED — AB

## 2016-04-22 LAB — HPV TYPE 16/18
HPV GENOTYPE, 16: NOT DETECTED
HPV GENOTYPE, 18: NOT DETECTED

## 2016-05-06 ENCOUNTER — Encounter: Payer: Self-pay | Admitting: Family Medicine

## 2016-05-06 DIAGNOSIS — R87811 Vaginal high risk human papillomavirus (HPV) DNA test positive: Secondary | ICD-10-CM | POA: Insufficient documentation

## 2016-05-14 ENCOUNTER — Ambulatory Visit: Payer: BLUE CROSS/BLUE SHIELD | Admitting: Gastroenterology

## 2016-06-11 ENCOUNTER — Other Ambulatory Visit: Payer: Self-pay

## 2016-06-11 ENCOUNTER — Encounter: Payer: Self-pay | Admitting: Gastroenterology

## 2016-06-11 ENCOUNTER — Ambulatory Visit (INDEPENDENT_AMBULATORY_CARE_PROVIDER_SITE_OTHER): Payer: BLUE CROSS/BLUE SHIELD | Admitting: Gastroenterology

## 2016-06-11 VITALS — BP 113/74 | HR 89 | Temp 97.5°F | Ht 61.0 in | Wt 112.4 lb

## 2016-06-11 DIAGNOSIS — R634 Abnormal weight loss: Secondary | ICD-10-CM | POA: Diagnosis not present

## 2016-06-11 DIAGNOSIS — R194 Change in bowel habit: Secondary | ICD-10-CM

## 2016-06-11 NOTE — Progress Notes (Signed)
Gastroenterology Consultation  Referring Provider:     Luciana Axe, NP Primary Care Physician:  Tasha Mouse, NP Primary Gastroenterologist:  Tasha Simmons     Reason for Consultation:     Abdominal discomfort        HPI:   Tasha Simmons is a 45 y.o. y/o female referred for consultation & management of Abdominal discomfort by Dr. Leata Mouse, NP.  This patient comes in today with a history of abdominal discomfort. The patient states she gets a sharp pain that starts in the right side that radiates to her left side. The patient states it has woken her from a sound sleep. The patient denies any association with food or with not eating. She also denies the pain to be associated with moving her bowels. The patient does report that she has had this for the last month or so and she has lost 20 pounds in the last few months. There is no report of any black stools or bloody stools. The patient also denies any family history of colon cancer colon polyps. The patient does smoke but has never had a colonoscopy or EGD in the past. She was started on omeprazole and states that she had side effects which caused her to stop the omeprazole but she did not notice any difference with the omeprazole. The patient also states that she was on Zantac and has also not seen any change. The pain is a burning pain in characteristics.  Past Medical History:  Diagnosis Date  . Acid reflux   . Benign breast cyst in female    5 years ago.     Past Surgical History:  Procedure Laterality Date  . BACK SURGERY    . BREAST BIOPSY Left 7 years ago   Benign no scar    Prior to Admission medications   Medication Sig Start Date End Date Taking? Authorizing Provider  ranitidine (ZANTAC) 150 MG capsule Take 1 capsule (150 mg total) by mouth 2 (two) times daily. 02/12/16  Yes Tasha Overton Mam, NP  pantoprazole (PROTONIX) 40 MG tablet Take 1 tablet (40 mg total) by mouth daily. Patient not taking: Reported on 06/11/2016  04/08/16   Tasha Overton Mam, NP    Family History  Problem Relation Age of Onset  . Hyperlipidemia Mother   . Heart disease Father   . Hyperlipidemia Father   . Heart disease Maternal Grandmother      Social History  Substance Use Topics  . Smoking status: Current Every Day Smoker    Packs/day: 0.50  . Smokeless tobacco: Never Used  . Alcohol use No    Allergies as of 06/11/2016  . (No Known Allergies)    Review of Systems:    All systems reviewed and negative except where noted in HPI.   Physical Exam:  BP 113/74   Pulse 89   Temp 97.5 F (36.4 C) (Oral)   Ht 5\' 1"  (1.549 m)   Wt 112 lb 6.4 oz (51 kg)   BMI 21.24 kg/m  No LMP recorded. Psych:  Alert and cooperative. Normal mood and affect. General:   Alert,  Well-developed, well-nourished, pleasant and cooperative in NAD Head:  Normocephalic and atraumatic. Eyes:  Sclera clear, no icterus.   Conjunctiva pink. Ears:  Normal auditory acuity. Nose:  No deformity, discharge, or lesions. Mouth:  No deformity or lesions,oropharynx pink & moist. Neck:  Supple; no masses or thyromegaly. Lungs:  Respirations even and unlabored.  Clear throughout  to auscultation.   No wheezes, crackles, or rhonchi. No acute distress. Heart:  Regular rate and rhythm; no murmurs, clicks, rubs, or gallops. Abdomen:  Normal bowel sounds.  No bruits.  Soft, non-tender and non-distended without masses, hepatosplenomegaly or hernias noted.  No guarding or rebound tenderness.  Negative Carnett sign.   Rectal:  Deferred.  Msk:  Symmetrical without gross deformities.  Good, equal movement & strength bilaterally. Pulses:  Normal pulses noted. Extremities:  No clubbing or edema.  No cyanosis. Neurologic:  Alert and oriented x3;  grossly normal neurologically. Skin:  Intact without significant lesions or rashes.  No jaundice. Lymph Nodes:  No significant cervical adenopathy. Psych:  Alert and cooperative. Normal mood and affect.  Imaging Studies: No  results found.  Assessment and Plan:   Tasha Simmons is a 45 y.o. y/o female who comes in with intermittent abdominal pain that has not made any better or worse with eating or drinking. She also reports that the pain is not associated with her bowel movements. Of concern is that the patient has lost 20 pounds in the recent past and has also had a change in bowel habits with constipation. The patient will be set up for an EGD and colonoscopy to evaluate her GI tract for possible cause of her weight loss and constipation. I have discussed risks & benefits which include, but are not limited to, bleeding, infection, perforation & drug reaction.  The patient agrees with this plan & written consent will be obtained.       Tasha Lame, MD. Tasha Simmons   Note: This dictation was prepared with Dragon dictation along with smaller phrase technology. Any transcriptional errors that result from this process are unintentional.

## 2016-06-28 ENCOUNTER — Encounter: Payer: Self-pay | Admitting: *Deleted

## 2016-06-28 ENCOUNTER — Encounter
Admission: RE | Disposition: A | Discharge status: Home / Self Care | Payer: Self-pay | Source: Ambulatory Visit | Attending: Gastroenterology

## 2016-06-28 ENCOUNTER — Ambulatory Visit: Payer: BLUE CROSS/BLUE SHIELD | Admitting: Certified Registered Nurse Anesthetist

## 2016-06-28 ENCOUNTER — Ambulatory Visit
Admission: RE | Admit: 2016-06-28 | Discharge: 2016-06-28 | Disposition: A | Discharge status: Home / Self Care | Payer: BLUE CROSS/BLUE SHIELD | Source: Ambulatory Visit | Attending: Gastroenterology | Admitting: Gastroenterology

## 2016-06-28 DIAGNOSIS — K219 Gastro-esophageal reflux disease without esophagitis: Secondary | ICD-10-CM | POA: Diagnosis not present

## 2016-06-28 DIAGNOSIS — K2980 Duodenitis without bleeding: Secondary | ICD-10-CM | POA: Diagnosis not present

## 2016-06-28 DIAGNOSIS — F17210 Nicotine dependence, cigarettes, uncomplicated: Secondary | ICD-10-CM | POA: Diagnosis not present

## 2016-06-28 DIAGNOSIS — K2950 Unspecified chronic gastritis without bleeding: Secondary | ICD-10-CM | POA: Insufficient documentation

## 2016-06-28 DIAGNOSIS — R1013 Epigastric pain: Secondary | ICD-10-CM

## 2016-06-28 DIAGNOSIS — K3189 Other diseases of stomach and duodenum: Secondary | ICD-10-CM | POA: Diagnosis not present

## 2016-06-28 DIAGNOSIS — D125 Benign neoplasm of sigmoid colon: Secondary | ICD-10-CM

## 2016-06-28 DIAGNOSIS — R634 Abnormal weight loss: Secondary | ICD-10-CM

## 2016-06-28 DIAGNOSIS — Z6821 Body mass index (BMI) 21.0-21.9, adult: Secondary | ICD-10-CM | POA: Insufficient documentation

## 2016-06-28 DIAGNOSIS — B9681 Helicobacter pylori [H. pylori] as the cause of diseases classified elsewhere: Secondary | ICD-10-CM | POA: Diagnosis not present

## 2016-06-28 DIAGNOSIS — K640 First degree hemorrhoids: Secondary | ICD-10-CM | POA: Diagnosis not present

## 2016-06-28 DIAGNOSIS — K317 Polyp of stomach and duodenum: Secondary | ICD-10-CM | POA: Diagnosis not present

## 2016-06-28 DIAGNOSIS — K635 Polyp of colon: Secondary | ICD-10-CM | POA: Diagnosis not present

## 2016-06-28 DIAGNOSIS — R109 Unspecified abdominal pain: Secondary | ICD-10-CM | POA: Diagnosis not present

## 2016-06-28 HISTORY — PX: COLONOSCOPY WITH PROPOFOL: SHX5780

## 2016-06-28 HISTORY — PX: ESOPHAGOGASTRODUODENOSCOPY (EGD) WITH PROPOFOL: SHX5813

## 2016-06-28 LAB — POCT PREGNANCY, URINE: PREG TEST UR: NEGATIVE

## 2016-06-28 SURGERY — COLONOSCOPY WITH PROPOFOL
Anesthesia: General

## 2016-06-28 MED ORDER — LIDOCAINE HCL (CARDIAC) 20 MG/ML IV SOLN
INTRAVENOUS | Status: DC | PRN
  Administered 2016-06-28: 80 mg via INTRAVENOUS

## 2016-06-28 MED ORDER — OMEPRAZOLE 40 MG PO CPDR: 40.0000 mg | DELAYED_RELEASE_CAPSULE | Freq: Every day | ORAL | 2 refills | Status: DC

## 2016-06-28 MED ORDER — SUCRALFATE 1 GM/10ML PO SUSP: 1.0000 g | Freq: Three times a day (TID) | ORAL | 0 refills | Status: DC

## 2016-06-28 MED ORDER — SODIUM CHLORIDE 0.9 % IV SOLN
INTRAVENOUS | Status: DC
  Administered 2016-06-28: 1000 mL via INTRAVENOUS

## 2016-06-28 MED ORDER — PROPOFOL 10 MG/ML IV BOLUS
INTRAVENOUS | Status: DC | PRN
  Administered 2016-06-28: 20 mg via INTRAVENOUS
  Administered 2016-06-28: 40 mg via INTRAVENOUS
  Administered 2016-06-28 (×2): 20 mg via INTRAVENOUS

## 2016-06-28 MED ORDER — PROPOFOL 500 MG/50ML IV EMUL
INTRAVENOUS | Status: DC | PRN
  Administered 2016-06-28: 140 ug/kg/min via INTRAVENOUS

## 2016-06-28 MED ORDER — FENTANYL CITRATE (PF) 100 MCG/2ML IJ SOLN
INTRAMUSCULAR | Status: DC | PRN
  Administered 2016-06-28: 50 ug via INTRAVENOUS

## 2016-06-28 NOTE — Op Note (Signed)
[facility] Gastroenterology Patient Name: Tasha Simmons Procedure Date: 06/28/2016 8:07 AM MRN: [id number] Account #: [id number] Date of Birth: 19-Oct-1970 Admit Type: Outpatient Age: 45 Room: [facility] ENDO ROOM 1 Gender: Female Note Status: Finalized Procedure:            Upper GI endoscopy Indications:          Epigastric abdominal pain, Weight loss Providers:            Jonathon Bellows MD, MD Referring MD:         Leata Mouse (Referring MD) Medicines:            Monitored Anesthesia Care Complications:        No immediate complications. Procedure:            Pre-Anesthesia Assessment:                       - Prior to the procedure, a History and Physical was                        performed, and patient medications, allergies and                        sensitivities were reviewed. The patient's tolerance of                        previous anesthesia was reviewed.                       - The risks and benefits of the procedure and the                        sedation options and risks were discussed with the                        patient. All questions were answered and informed                        consent was obtained.                       - ASA Grade Assessment: II - A patient with mild                        systemic disease.                       After obtaining informed consent, the endoscope was                        passed under direct vision. Throughout the procedure,                        the patient's blood pressure, pulse, and oxygen                        saturations were monitored continuously. The Endoscope                        was introduced through the mouth, and advanced to the  third part of duodenum. The upper GI endoscopy was                        accomplished with ease. The patient tolerated the                        procedure well. Findings:      The esophagus was normal.      The entire examined stomach was normal.  Biopsies were taken with a cold       forceps for histology.      Diffuse moderate inflammation characterized by congestion (edema),       erythema and shallow ulcerations was found in the duodenal bulb.       Biopsies were taken with a cold forceps for histology.      The third portion of the duodenum was normal. Biopsies for histology       were taken with a cold forceps for evaluation of celiac disease.      One 10 mm semi-sessile polyp with no bleeding was found in the second       portion of the duodenum. It appeared to have an opening in the center       with bile coming out- possible accesory pancreatic duct or minor pappila Impression:           - Normal esophagus.                       - Normal stomach. Biopsied.                       - Duodenitis. Biopsied.                       - Normal third portion of the duodenum. Biopsied.                       - One duodenal polyp. Recommendation:       - Patient has a contact number available for                        emergencies. The signs and symptoms of potential                        delayed complications were discussed with the patient.                        Return to normal activities tomorrow. Written discharge                        instructions were provided to the patient.                       - Resume previous diet.                       - Continue present medications.                       - Await pathology results.                       - Repeat upper endoscopy in 6 weeks to check healing.                       -  Return to GI clinic in 8 weeks.                       - Discharge patient to home (with escort).                       - Use Prilosec (omeprazole) 40 mg PO daily for 8 weeks.                       - Use sucralfate tablets 1 gram PO QID for 8 weeks.                       - I would like Dr Allen Norris to perform the next EGD to                        evaluate the polyp in the duodenum to see if it would                         need any further evaluation Procedure Code(s):    --- Professional ---                       [phone], Esophagogastroduodenoscopy, flexible, transoral;                        with biopsy, single or multiple Diagnosis Code(s):    --- Professional ---                       K29.80, Duodenitis without bleeding                       K31.7, Polyp of stomach and duodenum                       R10.13, Epigastric pain                       R63.4, Abnormal weight loss CPT copyright 2016 [employer]. All rights reserved. The codes documented in this report are preliminary and upon coder review may  be revised to meet current compliance requirements. Jonathon Bellows, MD Jonathon Bellows MD, MD 06/28/2016 8:23:00 AM This report has been signed electronically. Number of Addenda: 0 Note Initiated On: 06/28/2016 8:07 AM      [facility]

## 2016-06-28 NOTE — Transfer of Care (Signed)
Immediate Anesthesia Transfer of Care Note  Patient: Tasha Simmons  Procedure(s) Performed: Procedure(s): COLONOSCOPY WITH PROPOFOL (N/A) ESOPHAGOGASTRODUODENOSCOPY (EGD) WITH PROPOFOL (N/A)  Patient Location: PACU  Anesthesia Type:General  Level of Consciousness: awake, alert  and oriented  Airway & Oxygen Therapy: Patient Spontanous Breathing and Patient connected to nasal cannula oxygen  Post-op Assessment: Report given to RN and Post -op Vital signs reviewed and stable  Post vital signs: Reviewed and stable  Last Vitals:  Vitals:   06/28/16 0714  BP: (!) 126/97  Pulse: 79  Resp: 18  Temp: 36.2 C    Last Pain:  Vitals:   06/28/16 0714  TempSrc: Tympanic         Complications: No apparent anesthesia complications

## 2016-06-28 NOTE — H&P (Signed)
  Jonathon Bellows MD [address] [place], [place] [address] Phone: [phone] Fax : [phone]  Primary Care Physician:  Leata Mouse, NP Primary Gastroenterologist:  Dr. Jonathon Bellows   Pre-Procedure History & Physical: HPI:  Tasha Simmons is a 45 y.o. female is here for an endoscopy and colonoscopy.   Past Medical History:  Diagnosis Date  . Acid reflux   . Benign breast cyst in female    5 years ago.     Past Surgical History:  Procedure Laterality Date  . BACK SURGERY    . BREAST BIOPSY Left 7 years ago   Benign no scar    Prior to Admission medications   Medication Sig Start Date End Date Taking? Authorizing Provider  pantoprazole (PROTONIX) 40 MG tablet Take 1 tablet (40 mg total) by mouth daily. Patient not taking: Reported on 06/11/2016 04/08/16   Amy Overton Mam, NP  ranitidine (ZANTAC) 150 MG capsule Take 1 capsule (150 mg total) by mouth 2 (two) times daily. 02/12/16   Amy Overton Mam, NP    Allergies as of 06/11/2016  . (No Known Allergies)    Family History  Problem Relation Age of Onset  . Hyperlipidemia Mother   . Heart disease Father   . Hyperlipidemia Father   . Heart disease Maternal Grandmother     Social History   Social History  . Marital status: Single    Spouse name: N/A  . Number of children: N/A  . Years of education: N/A   Occupational History  . Not on file.   Social History Main Topics  . Smoking status: Current Every Day Smoker    Packs/day: 0.50  . Smokeless tobacco: Never Used  . Alcohol use No  . Drug use: No  . Sexual activity: Not on file   Other Topics Concern  . Not on file   Social History Narrative  . No narrative on file    Review of Systems: See HPI, otherwise negative ROS  Physical Exam: BP (!) 126/97   Pulse 79   Temp 97.1 F (36.2 C) (Tympanic)   Resp 18   Ht 5\' 1"  (1.549 m)   Wt 112 lb (50.8 kg)   SpO2 100%   BMI 21.16 kg/m  General:   Alert,  pleasant and cooperative in NAD Head:   Normocephalic and atraumatic. Neck:  Supple; no masses or thyromegaly. Lungs:  Clear throughout to auscultation.    Heart:  Regular rate and rhythm. Abdomen:  Soft, nontender and nondistended. Normal bowel sounds, without guarding, and without rebound.   Neurologic:  Alert and  oriented x4;  grossly normal neurologically.  Impression/Plan: Tasha Simmons is here for an endoscopy and colonoscopy to be performed for abdominal pain and weight loss .  Risks, benefits, limitations, and alternatives regarding  endoscopy and colonoscopy have been reviewed with the patient.  Questions have been answered.  All parties agreeable.   Jonathon Bellows, MD  06/28/2016, 8:05 AM

## 2016-06-28 NOTE — Anesthesia Preprocedure Evaluation (Signed)
Anesthesia Evaluation  Patient identified by MRN, date of birth, ID band Patient awake    Reviewed: Allergy & Precautions, NPO status , Patient's Chart, lab work & pertinent test results  History of Anesthesia Complications Negative for: history of anesthetic complications  Airway Mallampati: II       Dental   Pulmonary Current Smoker,           Cardiovascular negative cardio ROS       Neuro/Psych negative neurological ROS     GI/Hepatic Neg liver ROS, GERD  Medicated and Poorly Controlled,  Endo/Other  negative endocrine ROS  Renal/GU negative Renal ROS     Musculoskeletal negative musculoskeletal ROS (+)   Abdominal   Peds  Hematology  (+) anemia ,   Anesthesia Other Findings   Reproductive/Obstetrics                             Anesthesia Physical Anesthesia Plan  ASA: II  Anesthesia Plan: General   Post-op Pain Management:    Induction: Intravenous  Airway Management Planned: Nasal Cannula  Additional Equipment:   Intra-op Plan:   Post-operative Plan:   Informed Consent: I have reviewed the patients History and Physical, chart, labs and discussed the procedure including the risks, benefits and alternatives for the proposed anesthesia with the patient or authorized representative who has indicated his/her understanding and acceptance.     Plan Discussed with:   Anesthesia Plan Comments:         Anesthesia Quick Evaluation

## 2016-06-28 NOTE — Anesthesia Postprocedure Evaluation (Signed)
Anesthesia Post Note  Patient: Tasha Simmons  Procedure(s) Performed: Procedure(s) (LRB): COLONOSCOPY WITH PROPOFOL (N/A) ESOPHAGOGASTRODUODENOSCOPY (EGD) WITH PROPOFOL (N/A)  Patient location during evaluation: Endoscopy Anesthesia Type: General Level of consciousness: awake and alert Pain management: pain level controlled Vital Signs Assessment: post-procedure vital signs reviewed and stable Respiratory status: spontaneous breathing and respiratory function stable Cardiovascular status: stable Anesthetic complications: no    Last Vitals:  Vitals:   06/28/16 0714 06/28/16 0850  BP: (!) 126/97 102/79  Pulse: 79 77  Resp: 18 18  Temp: 36.2 C (!) 36.1 C    Last Pain:  Vitals:   06/28/16 0714  TempSrc: Tympanic                 KEPHART,WILLIAM K

## 2016-06-28 NOTE — Anesthesia Procedure Notes (Signed)
Performed by: Karyme Mcconathy Pre-anesthesia Checklist: Patient identified, Suction available, Emergency Drugs available, Patient being monitored and Timeout performed Patient Re-evaluated:Patient Re-evaluated prior to inductionOxygen Delivery Method: Nasal cannula Intubation Type: IV induction       

## 2016-06-28 NOTE — Op Note (Signed)
[facility] Gastroenterology Patient Name: Tasha Simmons Procedure Date: 06/28/2016 8:06 AM MRN: [id number] Account #: [id number] Date of Birth: Nov 28, 1970 Admit Type: Outpatient Age: 45 Room: [facility] ENDO ROOM 1 Gender: Female Note Status: Finalized Procedure:            Colonoscopy Indications:          Weight loss Providers:            Jonathon Bellows MD, MD Referring MD:         Leata Mouse (Referring MD) Medicines:            Monitored Anesthesia Care Complications:        No immediate complications. Procedure:            Pre-Anesthesia Assessment:                       - Prior to the procedure, a History and Physical was                        performed, and patient medications, allergies and                        sensitivities were reviewed. The patient's tolerance of                        previous anesthesia was reviewed.                       - The risks and benefits of the procedure and the                        sedation options and risks were discussed with the                        patient. All questions were answered and informed                        consent was obtained.                       - The risks and benefits of the procedure and the                        sedation options and risks were discussed with the                        patient. All questions were answered and informed                        consent was obtained.                       - ASA Grade Assessment: II - A patient with mild                        systemic disease.                       After obtaining informed consent, the colonoscope was                        passed  under direct vision. Throughout the procedure,                        the patient's blood pressure, pulse, and oxygen                        saturations were monitored continuously. The                        Colonoscope was introduced through the anus and                        advanced to the the cecum,  identified by the                        appendiceal orifice, IC valve and transillumination.                        The colonoscopy was performed with ease. The patient                        tolerated the procedure well. The quality of the bowel                        preparation was good. Findings:      The perianal and digital rectal examinations were normal.      Non-bleeding internal hemorrhoids were found during retroflexion. The       hemorrhoids were medium-sized and Grade I (internal hemorrhoids that do       not prolapse).      A 8 mm polyp was found in the sigmoid colon. The polyp was sessile. The       polyp was removed with a hot snare. Resection and retrieval were       complete. Impression:           - Non-bleeding internal hemorrhoids.                       - One 8 mm polyp in the sigmoid colon, removed with a                        hot snare. Resected and retrieved. Recommendation:       - Await pathology results.                       - Discharge patient to home (with escort).                       - Resume previous diet.                       - Repeat colonoscopy for surveillance based on                        pathology results.                       - Patient has a contact number available for                        emergencies. The signs and symptoms of potential  delayed complications were discussed with the patient.                        Return to normal activities tomorrow. Written discharge                        instructions were provided to the patient. Procedure Code(s):    --- Professional ---                       [phone], Colonoscopy, flexible; with removal of tumor(s),                        polyp(s), or other lesion(s) by snare technique Diagnosis Code(s):    --- Professional ---                       D12.5, Benign neoplasm of sigmoid colon                       K64.0, First degree hemorrhoids                       R63.4,  Abnormal weight loss CPT copyright 2016 American Medical Association. All rights reserved. The codes documented in this report are preliminary and upon coder review may  be revised to meet current compliance requirements. Jonathon Bellows, MD Jonathon Bellows MD, MD 06/28/2016 8:46:30 AM This report has been signed electronically. Number of Addenda: 0 Note Initiated On: 06/28/2016 8:06 AM Scope Withdrawal Time: 0 hours 16 minutes 26 seconds  Total Procedure Duration: 0 hours 19 minutes 4 seconds       [facility]

## 2016-07-01 ENCOUNTER — Encounter: Payer: Self-pay | Admitting: Gastroenterology

## 2016-07-01 LAB — SURGICAL PATHOLOGY

## 2016-07-07 ENCOUNTER — Other Ambulatory Visit: Payer: Self-pay | Admitting: Gastroenterology

## 2016-07-08 ENCOUNTER — Other Ambulatory Visit: Payer: Self-pay | Admitting: Gastroenterology

## 2016-07-08 NOTE — Telephone Encounter (Signed)
Refill request. Please advise.  

## 2016-07-11 ENCOUNTER — Telehealth: Payer: Self-pay

## 2016-07-11 NOTE — Telephone Encounter (Signed)
-----   Message from Jonathon Bellows, MD sent at 07/10/2016 12:07 PM EST ----- Duodenal bx- shows duodenitis , ulcers were seen . 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   She also needs repeat EGD with Dr Allen Norris after treatment

## 2016-07-11 NOTE — Telephone Encounter (Signed)
LVM for pt to return my call.

## 2016-07-12 NOTE — Telephone Encounter (Signed)
Patient is returning your phone call. 

## 2016-07-15 ENCOUNTER — Other Ambulatory Visit: Payer: Self-pay

## 2016-07-15 MED ORDER — AMOXICILLIN 500 MG PO CAPS: 1000.0000 mg | ORAL_CAPSULE | Freq: Two times a day (BID) | ORAL | 0 refills | Status: AC

## 2016-07-15 MED ORDER — CLARITHROMYCIN 500 MG PO TABS: 500.0000 mg | ORAL_TABLET | Freq: Two times a day (BID) | ORAL | 0 refills | Status: AC

## 2016-07-15 NOTE — Telephone Encounter (Signed)
Pt notified of EGD/Colonoscopy results. Rx's sent to pt's pharmacy.

## 2016-07-18 ENCOUNTER — Telehealth: Payer: Self-pay | Admitting: Gastroenterology

## 2016-07-18 NOTE — Telephone Encounter (Signed)
Patient has a question regarding Clarithromycin and amoxicillian. She is taking them for H Pylori. She also is taking Carafate. She was reading where these medications can interfere with each other. Should she take all three? Please call. She is a patient of Dr. Dorothey Baseman but Galen Daft is out until Tuesday.

## 2016-07-24 NOTE — Telephone Encounter (Signed)
Per Dr. Allen Norris, pt is to stop carafate during h pylori treatment. Left vm letting pt know this information.

## 2016-08-20 ENCOUNTER — Other Ambulatory Visit: Payer: Self-pay | Admitting: Family Medicine

## 2016-08-20 MED ORDER — OMEPRAZOLE 40 MG PO CPDR: 40.0000 mg | DELAYED_RELEASE_CAPSULE | Freq: Every day | ORAL | 2 refills | Status: DC

## 2016-09-09 ENCOUNTER — Other Ambulatory Visit: Payer: Self-pay | Admitting: Family Medicine

## 2016-09-09 ENCOUNTER — Encounter: Payer: Self-pay | Admitting: Family Medicine

## 2016-09-09 ENCOUNTER — Ambulatory Visit (INDEPENDENT_AMBULATORY_CARE_PROVIDER_SITE_OTHER): Payer: BLUE CROSS/BLUE SHIELD | Admitting: Family Medicine

## 2016-09-09 VITALS — BP 121/81 | HR 83 | Temp 98.8°F | Resp 16 | Ht 61.0 in | Wt 113.0 lb

## 2016-09-09 DIAGNOSIS — E7800 Pure hypercholesterolemia, unspecified: Secondary | ICD-10-CM

## 2016-09-09 DIAGNOSIS — Z720 Tobacco use: Secondary | ICD-10-CM

## 2016-09-09 DIAGNOSIS — Z0000 Encounter for general adult medical examination without abnormal findings: Secondary | ICD-10-CM

## 2016-09-09 DIAGNOSIS — R011 Cardiac murmur, unspecified: Secondary | ICD-10-CM | POA: Diagnosis not present

## 2016-09-09 DIAGNOSIS — Z716 Tobacco abuse counseling: Secondary | ICD-10-CM

## 2016-09-09 DIAGNOSIS — E785 Hyperlipidemia, unspecified: Secondary | ICD-10-CM | POA: Insufficient documentation

## 2016-09-09 NOTE — Assessment & Plan Note (Signed)
Discussion today >5 minutes (<10 minutes) specifically on counseling on risks of tobacco use, complications, treatment, smoking cessation. - She was already prescribed Zyban by her employee doctor - Reviewed and given handout on administration instructions for Zyban, counseling with concern for reducing insomnia, GI side effects, and target quit date within 2 weeks - Follow-up with employee health as planned, may return here if needed

## 2016-09-09 NOTE — Patient Instructions (Addendum)
Thank you for coming in to clinic today.  1. Reassurance on the heart murmur - as discussed it can be totally normal - I do not hear a significant or concerning murmur today - We can consider an ECHOCardiogram heart ultrasound test in the future if needed to rule out any problems with your heart and the heart valves if you are concerned about this or develop symptoms - Symptoms to look out for include palpitations, fluttering heart, rapid heart beat, shortness of breath or chest pain pressure especially with exertion, sometimes systemic symptoms with quick onset sweats, nausea, chest symptoms, or more severe would be syncope or passing out  We can listen to your heart and check this out yearly or whenever you are in doctors office in future  For smoking, continue the plan with Zyban and follow-up as needed  Congratulations on starting the plan to quit smoking!  You will be due for FASTING BLOOD WORK (no food or drink after midnight before, only water or coffee without cream/sugar on the morning of)  - Please go ahead and schedule a "Lab Only" visit in the morning at the clinic for lab draw in 6 months - Make sure Lab Only appointment is at least 1-2 weeks before your next appointment, so that results will be available  ** CALL OUR OFFICE 1 week BEFORE YOU ARE SCHEDULE TO GET LABS - SO WE CAN ORDER THE TESTS **  For Lab Results, once available within 2-3 days of blood draw, you can can log in to MyChart online to view your results and a brief explanation. Also, we can discuss results at next follow-up visit.  Please schedule a follow-up appointment with Dr. Parks Ranger / or new NP Cassell Smiles in 3-6 months for Annual Physical  If you have any other questions or concerns, please feel free to call the clinic or send a message through [place]. You may also schedule an earlier appointment if necessary.  Nobie Putnam, DO [place]

## 2016-09-09 NOTE — Progress Notes (Signed)
Subjective:    Patient ID: Tasha Simmons, female    DOB: 1971/03/21, 46 y.o.   MRN: [id number]  Tasha Simmons is a 46 y.o. female presenting on 09/09/2016 for Heart Murmur (at work has clinic-[facility] as per RN  pt has heart murmur onset 4 days)   HPI   Tasha Simmons - Reports no prior history of heart murmur. Recent concern after visit to employee clinic at [facility], she went to discuss smoking and get rx for Zyban and was told on exam that she had a heart murmur, advised to see her PCP to get it checked out - Patient reports that she has no symptoms associated with this and was unaware of this until now. She is very active with variety of exercises and outdoor activities, spends a lot of time with her grandchildren and has not problem with exertion - No prior history of ECHO or other cardiac work-up - No significant family history of cardiac disease, MI or early cardiac death - Denies any palpitations, dyspnea, chest pain, exertional dyspnea or symptoms, syncope  TOBACCO ABUSE - Reports smoking history for past 14 years, smokes almost 1ppd now currently, has gradually gone up from 0.5 to 1ppd - No prior quit before, has not tried NRT or medications prior - No significant second hand smoke or family members who smoke - She was prescribed Zyban, has not been able to fill this rx yet due to the pharmacy not having it available  Social History  Substance Use Topics  . Smoking status: Current Every Day Smoker    Packs/day: 1.00  . Smokeless tobacco: Current User  . Alcohol use No    Review of Systems Per HPI unless specifically indicated above     Objective:    BP 121/81   Pulse 83   Temp 98.8 F (37.1 C) (Oral)   Resp 16   Ht 5\' 1"  (1.549 m)   Wt 113 lb (51.3 kg)   SpO2 100%   BMI 21.35 kg/m   Wt Readings from Last 3 Encounters:  09/09/16 113 lb (51.3 kg)  06/28/16 112 lb (50.8 kg)  06/11/16 112 lb 6.4 oz (51 kg)    Physical Exam  Constitutional: She is  oriented to person, place, and time. She appears well-developed and well-nourished. No distress.  Well-appearing, comfortable, cooperative  HENT:  Head: Normocephalic and atraumatic.  Mouth/Throat: Oropharynx is clear and moist.  Eyes: Conjunctivae are normal.  Neck: Normal range of motion. Neck supple.  No carotid bruits  Cardiovascular: Normal rate, regular rhythm and intact distal pulses.   Murmur (mild systolic murmur over L sternal border without radiation to carotids) heard. Pulmonary/Chest: Effort normal and breath sounds normal. No respiratory distress. She has no wheezes. She has no rales.  Musculoskeletal: She exhibits no edema.  Neurological: She is alert and oriented to person, place, and time.  Skin: Skin is warm and dry. She is not diaphoretic.  Psychiatric: Her behavior is normal.  Nursing note and vitals reviewed.      Assessment & Plan:   Problem List Items Addressed This Visit    Tobacco abuse    Discussion today >5 minutes (<10 minutes) specifically on counseling on risks of tobacco use, complications, treatment, smoking cessation. - She was already prescribed Zyban by her employee doctor - Reviewed and given handout on administration instructions for Zyban, counseling with concern for reducing insomnia, GI side effects, and target quit date within 2 weeks - Follow-up with employee health  as planned, may return here if needed      Heart murmur, systolic - Primary    Suspect benign incidental murmur, faint on exam today, no prior exams documented hearing this murmur. Unclear exact etiology, but reassurance given asymptomatic, and no significant family history of heart disease. No personal history of rheumatic fever. - Chronic smoker, but no history of HTN  Plan: 1. Discussion today on murmurs in general and possible pathology or causes, reviewed most likely this is benign problem, but consider ECHOcardiogram if wanted to pursue more definitive work-up, otherwise  can follow-up as needed and observe. She elects to observe this finding, and re-check at future visits, keep track if any symptoms, follow-up sooner as advised if develops a problem       Other Visit Diagnoses    Encounter for smoking cessation counseling          No orders of the defined types were placed in this encounter.     Follow up plan: Return in about 6 months (around 03/09/2017) for Annual Physical.  Nobie Putnam, DO [facility] 09/09/2016, 4:48 PM

## 2016-09-09 NOTE — Assessment & Plan Note (Signed)
Suspect benign incidental murmur, faint on exam today, no prior exams documented hearing this murmur. Unclear exact etiology, but reassurance given asymptomatic, and no significant family history of heart disease. No personal history of rheumatic fever. - Chronic smoker, but no history of HTN  Plan: 1. Discussion today on murmurs in general and possible pathology or causes, reviewed most likely this is benign problem, but consider ECHOcardiogram if wanted to pursue more definitive work-up, otherwise can follow-up as needed and observe. She elects to observe this finding, and re-check at future visits, keep track if any symptoms, follow-up sooner as advised if develops a problem

## 2016-10-04 DIAGNOSIS — M7551 Bursitis of right shoulder: Secondary | ICD-10-CM | POA: Diagnosis not present

## 2017-01-14 ENCOUNTER — Encounter: Payer: Self-pay | Admitting: Family Medicine

## 2017-01-14 ENCOUNTER — Ambulatory Visit (INDEPENDENT_AMBULATORY_CARE_PROVIDER_SITE_OTHER): Payer: BLUE CROSS/BLUE SHIELD | Admitting: Family Medicine

## 2017-01-14 ENCOUNTER — Other Ambulatory Visit: Payer: Self-pay | Admitting: Family Medicine

## 2017-01-14 VITALS — BP 110/78 | HR 92 | Temp 98.6°F | Resp 16 | Ht 61.0 in | Wt 121.0 lb

## 2017-01-14 DIAGNOSIS — E7800 Pure hypercholesterolemia, unspecified: Secondary | ICD-10-CM

## 2017-01-14 DIAGNOSIS — R799 Abnormal finding of blood chemistry, unspecified: Secondary | ICD-10-CM

## 2017-01-14 DIAGNOSIS — R319 Hematuria, unspecified: Secondary | ICD-10-CM | POA: Diagnosis not present

## 2017-01-14 DIAGNOSIS — N390 Urinary tract infection, site not specified: Secondary | ICD-10-CM | POA: Diagnosis not present

## 2017-01-14 DIAGNOSIS — Z0000 Encounter for general adult medical examination without abnormal findings: Secondary | ICD-10-CM

## 2017-01-14 DIAGNOSIS — D508 Other iron deficiency anemias: Secondary | ICD-10-CM

## 2017-01-14 LAB — POCT URINALYSIS DIPSTICK
Bilirubin, UA: NEGATIVE
Glucose, UA: NEGATIVE
Ketones, UA: NEGATIVE
NITRITE UA: POSITIVE
PH UA: 5 (ref 5.0–8.0)
PROTEIN UA: 2000
Spec Grav, UA: 1.025 (ref 1.010–1.025)
UROBILINOGEN UA: 0.2 U/dL

## 2017-01-14 MED ORDER — CEPHALEXIN 500 MG PO CAPS: 500.0000 mg | ORAL_CAPSULE | Freq: Three times a day (TID) | ORAL | 0 refills | Status: DC

## 2017-01-14 NOTE — Patient Instructions (Addendum)
Thank you for coming to the clinic today.  1. You have a Urinary Tract Infection - this is very common, your symptoms are reassuring and you should get better within 1 week on the antibiotics - Start Keflex 500mg  3 times daily for next 7 days, complete entire course, even if feeling better - We sent urine for a culture, we will call you within next few days if we need to change antibiotics - Please drink plenty of fluids, improve hydration over next 1 week  If symptoms worsening, developing nausea / vomiting, worsening back pain, fevers / chills / sweats, then please return for re-evaluation sooner.  To prevent bladder and kidney infections...  1. Pee within 30 minutes after sex  2. Wipe front to back after using the restroom  3. Drink enough water to keep your pee clear to pale yellow  If you think you are getting another bladder infection, start drinking cranberry juice and come see [place] so we can check the urine.  If worsening nausea / vomiting can't keep antibiotics down, higher flank / back pain, persistent blood in urine (even after symptoms resolve), or fevers/chills, may need more emergent evaluation at [facility] ED may need IV antibiotics if higher kidney infection instead of bladder.  In future, if you notice significant bleeding even without symptoms, or with, we should be cautious about testing for Bladder Cancer, due to history of smoking, you are higher risk.  Please schedule a Follow-up Appointment to: Return for Annual Physical as scheduled.  If you have any other questions or concerns, please feel free to call the clinic or send a message through Home. You may also schedule an earlier appointment if necessary.  Additionally, you may be receiving a survey about your experience at our clinic within a few days to 1 week by e-mail or mail. We value your feedback.  Nobie Putnam, DO [place]

## 2017-01-14 NOTE — Progress Notes (Signed)
Subjective:    Patient ID: Tasha Simmons, female    DOB: Dec 03, 1970, 46 y.o.   MRN: [id number]  Tasha Simmons is a 46 y.o. female presenting on 01/14/2017 for Urinary Tract Infection (urgency frequancy notice some blood in urine)  Patient presents for a same day appointment.  HPI   UTI Reports symptoms of UTI about 1 week ago with reported urinary frequency worse overnight and then worsening to during day with frequency. - Admits some suprapubic discomfort and pain following void - Admits some hematuria today with "saw small amount of red blood" but seemed mostly pink tinged or color changed urine but then seemed to be resolved after 1-2 voids. However in office today nurse staff noticed some small amount of gross blood in urine. - Denies fevers/chills, sweats, nausea, vomiting, diarrhea, back or flank  Social History  Substance Use Topics  . Smoking status: Current Every Day Smoker    Packs/day: 1.00  . Smokeless tobacco: Current User  . Alcohol use No    Review of Systems Per HPI unless specifically indicated above     Objective:    BP 110/78   Pulse 92   Temp 98.6 F (37 C) (Oral)   Resp 16   Ht 5\' 1"  (1.549 m)   Wt 121 lb (54.9 kg)   BMI 22.86 kg/m   Wt Readings from Last 3 Encounters:  01/14/17 121 lb (54.9 kg)  09/09/16 113 lb (51.3 kg)  06/28/16 112 lb (50.8 kg)    Physical Exam  Constitutional: She is oriented to person, place, and time. She appears well-developed and well-nourished. No distress.  Well-appearing, comfortable, cooperative  HENT:  Head: Normocephalic and atraumatic.  Mouth/Throat: Oropharynx is clear and moist.  Eyes: Conjunctivae are normal. Right eye exhibits no discharge. Left eye exhibits no discharge.  Cardiovascular: Normal rate and intact distal pulses.   Pulmonary/Chest: Effort normal.  Abdominal: Soft. Bowel sounds are normal. She exhibits no distension. There is no tenderness.  Musculoskeletal: She exhibits no edema.    Neurological: She is alert and oriented to person, place, and time.  Skin: Skin is warm and dry. No rash noted. She is not diaphoretic. No erythema.  Psychiatric: Her behavior is normal.  Nursing note and vitals reviewed.  Results for orders placed or performed in visit on 01/14/17  POCT Urinalysis Dipstick  Result Value Ref Range   Color, UA amber    Clarity, UA cloudy    Glucose, UA negative    Bilirubin, UA negative    Ketones, UA negative    Spec Grav, UA 1.025 1.010 - 1.025   Blood, UA large    pH, UA 5.0 5.0 - 8.0   Protein, UA 2,000    Urobilinogen, UA 0.2 0.2 or 1.0 E.U./dL   Nitrite, UA positive    Leukocytes, UA Large (3+) (A) Negative      Assessment & Plan:   Problem List Items Addressed This Visit    None    Visit Diagnoses    Urinary tract infection with hematuria, site unspecified    -  Primary  Clinically consistent with UTI and confirmed on UA. No recent UTIs or abx courses. No known complications. - No concern for pyelo today (no systemic symptoms, neg fever, back pain, n/v).  Plan: 1. Ordered Urine culture 2. Keflex 500mg  TID x 7 days 3. Improve PO hydration 4. RTC if no improvement 1-2 weeks, red flags given to return sooner  Additional concern with possible gross  hematuria, difficult to determine based on history, concern with smoker at age 50.Marland Kitchen Discussed other diagnostic possibilities, less likely nephrolithiasis based on history, concern would be for less likely bladder cancer. Clinically most consistent with UTI, advised if significant recurrence of gross hematuria even without UTI symptoms, then need to follow-up consider imaging with CT / and referral Urology for cystoscopy.    Relevant Medications   cephALEXin (KEFLEX) 500 MG capsule   Other Relevant Orders   POCT Urinalysis Dipstick (Completed)   CULTURE, URINE COMPREHENSIVE      Meds ordered this encounter  Medications  . cephALEXin (KEFLEX) 500 MG capsule    Sig: Take 1 capsule (500  mg total) by mouth 3 (three) times daily. For 7 days    Dispense:  21 capsule    Refill:  0     Follow up plan: Return for Annual Physical as scheduled.  Nobie Putnam, [facility] 01/14/2017, 8:42 PM

## 2017-01-16 LAB — CULTURE, URINE COMPREHENSIVE

## 2017-03-06 ENCOUNTER — Other Ambulatory Visit: Payer: BLUE CROSS/BLUE SHIELD

## 2017-03-10 DIAGNOSIS — E7800 Pure hypercholesterolemia, unspecified: Secondary | ICD-10-CM | POA: Diagnosis not present

## 2017-03-10 DIAGNOSIS — D508 Other iron deficiency anemias: Secondary | ICD-10-CM | POA: Diagnosis not present

## 2017-03-10 DIAGNOSIS — R799 Abnormal finding of blood chemistry, unspecified: Secondary | ICD-10-CM | POA: Diagnosis not present

## 2017-03-10 DIAGNOSIS — Z0000 Encounter for general adult medical examination without abnormal findings: Secondary | ICD-10-CM | POA: Diagnosis not present

## 2017-03-11 ENCOUNTER — Other Ambulatory Visit: Payer: BLUE CROSS/BLUE SHIELD

## 2017-03-11 DIAGNOSIS — Z0000 Encounter for general adult medical examination without abnormal findings: Secondary | ICD-10-CM

## 2017-03-11 DIAGNOSIS — E7800 Pure hypercholesterolemia, unspecified: Secondary | ICD-10-CM

## 2017-03-11 DIAGNOSIS — R799 Abnormal finding of blood chemistry, unspecified: Secondary | ICD-10-CM

## 2017-03-11 DIAGNOSIS — D508 Other iron deficiency anemias: Secondary | ICD-10-CM

## 2017-03-11 LAB — CBC WITH DIFFERENTIAL/PLATELET
BASOS ABS: 79 {cells}/uL (ref 0–200)
Basophils Relative: 1 %
EOS ABS: 158 {cells}/uL (ref 15–500)
Eosinophils Relative: 2 %
HEMATOCRIT: 36.1 % (ref 35.0–45.0)
HEMOGLOBIN: 11 g/dL — AB (ref 11.7–15.5)
LYMPHS ABS: 2686 {cells}/uL (ref 850–3900)
Lymphocytes Relative: 34 %
MCH: 24.3 pg — AB (ref 27.0–33.0)
MCHC: 30.5 g/dL — ABNORMAL LOW (ref 32.0–36.0)
MCV: 79.9 fL — AB (ref 80.0–100.0)
MONO ABS: 790 {cells}/uL (ref 200–950)
MPV: 8 fL (ref 7.5–12.5)
Monocytes Relative: 10 %
NEUTROS ABS: 4187 {cells}/uL (ref 1500–7800)
Neutrophils Relative %: 53 %
Platelets: 479 10*3/uL — ABNORMAL HIGH (ref 140–400)
RBC: 4.52 MIL/uL (ref 3.80–5.10)
RDW: 17.6 % — ABNORMAL HIGH (ref 11.0–15.0)
WBC: 7.9 10*3/uL (ref 3.8–10.8)

## 2017-03-12 LAB — COMPLETE METABOLIC PANEL WITH GFR
ALBUMIN: 3.8 g/dL (ref 3.6–5.1)
ALK PHOS: 45 U/L (ref 33–115)
ALT: 8 U/L (ref 6–29)
AST: 12 U/L (ref 10–30)
BUN: 12 mg/dL (ref 7–25)
CO2: 18 mmol/L — ABNORMAL LOW (ref 20–32)
Calcium: 8.7 mg/dL (ref 8.6–10.2)
Chloride: 108 mmol/L (ref 98–110)
Creat: 0.58 mg/dL (ref 0.50–1.10)
GFR, Est African American: >89 mL/min (ref >=60)
GLUCOSE: 83 mg/dL (ref 65–99)
POTASSIUM: 4.2 mmol/L (ref 3.5–5.3)
SODIUM: 137 mmol/L (ref 135–146)
Total Bilirubin: 0.3 mg/dL (ref 0.2–1.2)
Total Protein: 6.4 g/dL (ref 6.1–8.1)

## 2017-03-12 LAB — LIPID PANEL
CHOL/HDL RATIO: 2.9 ratio (ref <5.0)
CHOLESTEROL: 205 mg/dL — AB (ref <200)
HDL: 71 mg/dL (ref >50)
LDL Cholesterol: 120 mg/dL — ABNORMAL HIGH (ref <100)
Triglycerides: 72 mg/dL (ref <150)
VLDL: 14 mg/dL (ref <30)

## 2017-03-12 LAB — HEMOGLOBIN A1C
HEMOGLOBIN A1C: 4.8 % (ref <5.7)
Mean Plasma Glucose: 91 mg/dL

## 2017-03-17 ENCOUNTER — Ambulatory Visit (INDEPENDENT_AMBULATORY_CARE_PROVIDER_SITE_OTHER): Payer: BLUE CROSS/BLUE SHIELD | Admitting: Nurse Practitioner

## 2017-03-17 ENCOUNTER — Encounter: Payer: Self-pay | Admitting: Nurse Practitioner

## 2017-03-17 VITALS — BP 114/68 | HR 81 | Temp 98.6°F | Resp 16 | Ht 61.0 in | Wt 124.6 lb

## 2017-03-17 DIAGNOSIS — Z716 Tobacco abuse counseling: Secondary | ICD-10-CM

## 2017-03-17 DIAGNOSIS — Z720 Tobacco use: Secondary | ICD-10-CM

## 2017-03-17 DIAGNOSIS — D509 Iron deficiency anemia, unspecified: Secondary | ICD-10-CM | POA: Diagnosis not present

## 2017-03-17 DIAGNOSIS — H9192 Unspecified hearing loss, left ear: Secondary | ICD-10-CM | POA: Diagnosis not present

## 2017-03-17 DIAGNOSIS — Z0000 Encounter for general adult medical examination without abnormal findings: Secondary | ICD-10-CM

## 2017-03-17 DIAGNOSIS — N946 Dysmenorrhea, unspecified: Secondary | ICD-10-CM

## 2017-03-17 DIAGNOSIS — Z0001 Encounter for general adult medical examination with abnormal findings: Secondary | ICD-10-CM

## 2017-03-17 DIAGNOSIS — Z1231 Encounter for screening mammogram for malignant neoplasm of breast: Secondary | ICD-10-CM

## 2017-03-17 DIAGNOSIS — N6002 Solitary cyst of left breast: Secondary | ICD-10-CM | POA: Diagnosis not present

## 2017-03-17 NOTE — Progress Notes (Signed)
Subjective:    Patient ID: Tasha Simmons, female    DOB: 04-11-1971, 46 y.o.   MRN: [id number]  Tasha Simmons is a 46 y.o. female presenting on 03/17/2017 for Annual Exam (intermittent dizziness, lightheadedness x 2 weeks. Pt think it could be associated to her anemia. She states since stopping her iron supplements she notice these symptoms)   HPI Annual Physical Exam Patient has been feeling fairly well.  They have acute concerns today about dizziness that does not occur daily.    DIZZINESS:  - NO spinning, occasionally blurriness of vision for couple seconds,  Occurs when walking or at work on an [employer].  - History of iron deficiency anemia.  Has stopped taking her oral supplements.  Eats meat(chicken and shrimp) once every other day, and tuna salad daily.  Eats lots of vegetables, fruits, frequent fried foods.    - Started Chantix and then noticed dizziness. Only taking Chantix every other day.  DYSMENORRHEA:  Pt started period today.  Has periods every 2 weeks for 2-3 months, then may go 4-6 weeks without.  Pattern for about 5-6 months. Periods are "very heavy" and last 6 days.  Sleeps 5-6 hours per night uninterrupted.  HEALTH MAINTENANCE: Weight/BMI: healthy weight Physical activity: Last 3 weeks has been walking on treadmill, 3 days per week, 1 hour Diet: generally healthy, avoids  Seatbelt: always Sunscreen: for prolonged exposures PAP: Due - defer today r/t menses Mammogram: Had one last year - has breast cyst and needs repeat. LMP: 03/19/17 Colonoscopy: No family history of colon cancer.  Performed in December. Tobacco: Has quit smoking 3 months ago, but still has significant cravings.  VACCINES: Tetanus: up to date  Past Medical History:  Diagnosis Date  . Acid reflux   . Benign breast cyst in female    5 years ago.    Past Surgical History:  Procedure Laterality Date  . BACK SURGERY    . BREAST BIOPSY Left 7 years ago   Benign no scar  . COLONOSCOPY  WITH PROPOFOL N/A 06/28/2016   Procedure: COLONOSCOPY WITH PROPOFOL;  Surgeon: Jonathon Bellows, MD;  Location: [facility] ENDOSCOPY;  Service: Endoscopy;  Laterality: N/A;  . ESOPHAGOGASTRODUODENOSCOPY (EGD) WITH PROPOFOL N/A 06/28/2016   Procedure: ESOPHAGOGASTRODUODENOSCOPY (EGD) WITH PROPOFOL;  Surgeon: Jonathon Bellows, MD;  Location: [facility] ENDOSCOPY;  Service: Endoscopy;  Laterality: N/A;   Social History   Social History  . Marital status: Single    Spouse name: N/A  . Number of children: N/A  . Years of education: N/A   Occupational History  . Not on file.   Social History Main Topics  . Smoking status: Former Smoker    Packs/day: 1.00    Years: 13.00    Quit date: 12/15/2016  . Smokeless tobacco: Current User     Comment: Still battling cravings  . Alcohol use No  . Drug use: No  . Sexual activity: Not on file   Other Topics Concern  . Not on file   Social History Narrative  . No narrative on file   Family History  Problem Relation Age of Onset  . Hyperlipidemia Mother   . Heart disease Father   . Hyperlipidemia Father   . Heart disease Maternal Grandmother    Current Outpatient Prescriptions on File Prior to Visit  Medication Sig  . CARAFATE 1 GM/10ML suspension TAKE 10ML BY MOUTH 4 TIMES DAILY WITH MEALS AND AT BEDTIME  . cephALEXin (KEFLEX) 500 MG capsule Take 1 capsule (500  mg total) by mouth 3 (three) times daily. For 7 days (Patient not taking: Reported on 03/17/2017)   No current facility-administered medications on file prior to visit.     Review of Systems Per HPI unless specifically indicated above     Objective:    BP 114/68 (BP Location: Right Arm, Patient Position: Sitting, Cuff Size: Small)   Pulse 81   Temp 98.6 F (37 C)   Resp 16   Ht 5\' 1"  (1.549 m)   Wt 124 lb 9.6 oz (56.5 kg)   LMP 03/17/2017   SpO2 100%   BMI 23.54 kg/m   Wt Readings from Last 3 Encounters:  03/17/17 124 lb 9.6 oz (56.5 kg)  01/14/17 121 lb (54.9 kg)  09/09/16 113 lb (51.3  kg)    Physical Exam General - healthy, well-appearing, NAD HEENT - Normocephalic, atraumatic, PERRL, EOMI, patent nares w/o congestion, oropharynx clear, MMM, deaf left ear Neck - supple, non-tender, no LAD Heart - RRR, no murmurs heard Lungs - Clear throughout all lobes, no wheezing, crackles, or rhonchi. Normal work of breathing. Abdomen - soft, NTND, no masses, no hepatosplenomegaly, active bowel sounds Breast - Symmetric breasts, known cyst noted on left breast between 12 o'clock and 4 o'clock, right breast w/ possible fibrocystic tissue or mass at 10 o'clock, no nipple discharge, no skin changes or tenderness.  GU - deferred r/t ongoing menses Extremeties - non-tender, no edema, cap refill < 2 seconds, peripheral pulses intact +2 bilaterally Skin - warm, dry, no rashes Neuro - awake, alert, oriented x3, CN II-X intact except left CN VIII (deaf left ear), intact muscle strength 5/5 bilaterally, intact distal sensation to light touch, normal coordination, normal gait Psych - Normal mood and affect, normal behavior   Results for orders placed or performed in visit on 03/11/17  COMPLETE METABOLIC PANEL WITH GFR  Result Value Ref Range   Sodium 137 135 - 146 mmol/L   Potassium 4.2 3.5 - 5.3 mmol/L   Chloride 108 98 - 110 mmol/L   CO2 18 (L) 20 - 32 mmol/L   Glucose, Bld 83 65 - 99 mg/dL   BUN 12 7 - 25 mg/dL   Creat 03/13/17 8.34 - 4.93 mg/dL   Total Bilirubin 0.3 0.2 - 1.2 mg/dL   Alkaline Phosphatase 45 33 - 115 U/L   AST 12 10 - 30 U/L   ALT 8 6 - 29 U/L   Total Protein 6.4 6.1 - 8.1 g/dL   Albumin 3.8 3.6 - 5.1 g/dL   Calcium 8.7 8.6 - 2.84 mg/dL   GFR, Est African American >89 >=60 mL/min   GFR, Est Non African American >89 >=60 mL/min  Lipid panel  Result Value Ref Range   Cholesterol 205 (H) <200 mg/dL   Triglycerides 72 64.8 mg/dL   HDL 71 <696 mg/dL   Total CHOL/HDL Ratio 2.9 <5.0 Ratio   VLDL 14 <30 mg/dL   LDL Cholesterol >13 (H) <100 mg/dL  Hemoglobin 482  Result  Value Ref Range   Hgb A1c MFr Bld 4.8 <5.7 %   Mean Plasma Glucose 91 mg/dL  CBC with Differential/Platelet  Result Value Ref Range   WBC 7.9 3.8 - 10.8 K/uL   RBC 4.52 3.80 - 5.10 MIL/uL   Hemoglobin 11.0 (L) 11.7 - 15.5 g/dL   HCT F7J 85.9 - 21.0 %   MCV 79.9 (L) 80.0 - 100.0 fL   MCH 24.3 (L) 27.0 - 33.0 pg   MCHC 30.5 (L) 32.0 -  36.0 g/dL   RDW 79.3 (H) 41.6 - 10.6 %   Platelets 479 (H) 140 - 400 K/uL   MPV 8.0 7.5 - 12.5 fL   Neutro Abs 4,187 1,500 - 7,800 cells/uL   Lymphs Abs 2,686 850 - 3,900 cells/uL   Monocytes Absolute 790 200 - 950 cells/uL   Eosinophils Absolute 158 15 - 500 cells/uL   Basophils Absolute 79 0 - 200 cells/uL   Neutrophils Relative % 53 %   Lymphocytes Relative 34 %   Monocytes Relative 10 %   Eosinophils Relative 2 %   Basophils Relative 1 %   Smear Review Criteria for review not met       Assessment & Plan:   Problem List Items Addressed This Visit      Nervous and Auditory   Complete deafness, 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  No changes and normal hearing right ear.     Other   Benign cyst of left breast Pt needs surveillance of left breast cyst and screening for breast cancer in right breast.  Plan: 1. Left breast diagnostic mammo and [place] 2. Right breast screening mammo. 3. Follow up 6 mos-1 year depending on mammo findings.   Relevant Orders   MM Digital Diagnostic Unilat L   US BREAST LTD UNI LEFT INC AXILLA   Iron deficiency anemia History of iron deficiency.  Pt taking iron supplements.    Plan: 1. CBC and iron panel. 2. Change to every other day dosing.  Take 2-3 tablets ferrous sulfate each dose. 3. Follow up as needed.   Relevant Orders   CBC with Differential/Platelet   Fe+TIBC+Fer    Other Visit Diagnoses    Encounter for annual physical exam    -  Primary Physical exam with new findings in ROS of dysmenorrhea (see below).  Well adult with no other acute concerns.  Plan: 1. Obtain health maintenance  screenings. 2. Return 1 year for annual physical.    Breast cancer screening by mammogram     Right breast screening (see above).   Relevant Orders   MM Digital Screening Unilat R   Dysmenorrhea     Pt w/ heavy menses occurring q2weeks.  Pt perimenopausal, however, does not explain frequency over this period of time. Possible contributor to dizziness if anemia worsened.  Plan: 1. Evaluate for endometriosis vs fibroids w/ pelvis US.  Cannot exclude cancer w/ history. 2. Follow up w/ GYN if positive results.  Consider hormonal contraception for cycle regulation if normal [place].    Relevant Orders   US Transvaginal Non-OB   US Pelvis Complete   Encounter for smoking cessation counseling     Pt quit x 3 mos still taking chanitx every other day.  Chantix is possible cause of pt's dizziness since started w/ chantix administration and subsides w/ every other day dosing.    Plan: 1. STOP chantix.  Not likely assisting cravings r/t below therapeutic dose. 2. Discussed alternative options for addressing cravings. 3. Follow up as needed to discuss wellbutrin.  Discussion today >5 minutes (<10 minutes) specifically on counseling on risks of tobacco use, complications, treatment, smoking cessation.       Meds ordered this encounter  Medications  . DISCONTD: CHANTIX STARTING MONTH PAK 0.5 MG X 11 & 1 MG X 42 tablet      Follow up plan: Return in about 3 months (around 06/17/2017) for LABS ONLY 2-3 days prior to office visit for anemia and 2 weeks for  PAP only.  Cassell Smiles, DNP, AGPCNP-BC Adult Gerontology Primary Care Nurse Practitioner [facility] 03/19/2017, 7:50 AM

## 2017-03-17 NOTE — Patient Instructions (Addendum)
Tasha Simmons, Thank you for coming in to clinic today.  1. Your new iron medication instructions: Take 3 (three) tablets of 325 mg ferrous sulfate every other day 30 minutes before breakfast and with orange juice or your vitamin C tablet.  If this causes increased nausea, take one tablet the first week; two tablets the second week; and three tablets every other day after that.  If it is hard to take your pills every other day (Monday, Wednesday, Friday, Sunday, Tuesday, Thursday), then take them Monday, Wednesday, and Friday.  2. For your irregular menses: - We need to make sure there isn't something other than perimenopause going on.  I have ordered a pelvic ultrasound w/ transvaginal ultrasound.  We are looking for changes in the endometrial lining and possible fibroids.  3. For your dizziness:  - Could be related to your Chantix - stop taking this medication. - Could also be related to iron deficiency anemia. Start supplementation as above. We are also going to explore a possible cause for your frequent heavy periods.  Please schedule a follow-up appointment with Cassell Smiles, AGNP. Return in about 3 months (around 06/17/2017) for Labs ONLY 2-3 days prior and office visit for anemia and 2 weeks for PAP only.  If you have any other questions or concerns, please feel free to call the clinic or send a message through [place]. You may also schedule an earlier appointment if necessary.  You will receive a survey after today's visit either digitally by e-mail or paper by [employer]. Your experiences and feedback matter to [place].  Please respond so we know how we are doing as we provide care for you.   Cassell Smiles, DNP, AGNP-BC Adult Gerontology Nurse Practitioner [facility], [facility]     Iron Deficiency Anemia, Adult Iron deficiency anemia is a condition in which the concentration of red blood cells or hemoglobin in the blood is below normal because of too little iron. Hemoglobin is  a substance in red blood cells that carries oxygen to the body's tissues. When the concentration of red blood cells or hemoglobin is too low, not enough oxygen reaches these tissues. Iron deficiency anemia is usually long-lasting (chronic) and it develops over time. It may or may not cause symptoms. It is a common type of anemia. What are the causes? This condition may be caused by:  Not enough iron in the diet.  Blood loss caused by bleeding in the intestine.  Blood loss from a gastrointestinal condition like Crohn disease.  Frequent blood draws, such as from blood donation.  Abnormal absorption in the gut.  Heavy menstrual periods in women.  Cancers of the gastrointestinal system, such as colon cancer.  What are the signs or symptoms? Symptoms of this condition may include:  Fatigue.  Headache.  Pale skin, lips, and nail beds.  Poor appetite.  Weakness.  Shortness of breath.  Dizziness.  Cold hands and feet.  Fast or irregular heartbeat.  Irritability. This is more common in severe anemia.  Rapid breathing. This is more common in severe anemia.  Mild anemia may not cause any symptoms. How is this diagnosed? This condition is diagnosed based on:  Your medical history.  A physical exam.  Blood tests.  You may have additional tests to find the underlying cause of your anemia, such as:  Testing for blood in the stool (fecal occult blood test).  A procedure to see inside your colon and rectum (colonoscopy).  A procedure to see inside your  esophagus and stomach (endoscopy).  A test in which cells are removed from bone marrow (bone marrow aspiration) or fluid is removed from the bone marrow to be examined (biopsy). This is rarely needed.  How is this treated? This condition is treated by correcting the cause of your iron deficiency. Treatment may involve:  Adding iron-rich foods to your diet.  Taking iron supplements. If you are pregnant or  breastfeeding, you may need to take extra iron because your normal diet usually does not provide the amount of iron that you need.  Increasing vitamin C intake. Vitamin C helps your body absorb iron. Your health care provider may recommend that you take iron supplements along with a glass of orange juice or a vitamin C supplement.  Medicines to make heavy menstrual flow lighter.  Surgery.  You may need repeat blood tests to determine whether treatment is working. Depending on the underlying cause, the anemia should be corrected within 2 months of starting treatment. If the treatment does not seem to be working, you may need more testing. Follow these instructions at home: Medicines  Take over-the-counter and prescription medicines only as told by your health care provider. This includes iron supplements and vitamins.  If you cannot tolerate taking iron supplements by mouth, talk with your health care provider about taking them through a vein (intravenously) or an injection into a muscle.  For the best iron absorption, you should take iron supplements when your stomach is empty. If you cannot tolerate them on an empty stomach, you may need to take them with food.  Do not drink milk or take antacids at the same time as your iron supplements. Milk and antacids may interfere with iron absorption.  Iron supplements can cause constipation. To prevent constipation, include fiber in your diet as told by your health care provider. A stool softener may also be recommended. Eating and drinking  Talk with your health care provider before changing your diet. He or she may recommend that you eat foods that contain a lot of iron, such as: ? Liver. ? Low-fat (lean) beef. ? Breads and cereals that have iron added to them (are fortified). ? Eggs. ? Dried fruit. ? Dark green, leafy vegetables.  To help your body use the iron from iron-rich foods, eat those foods at the same time as fresh fruits and  vegetables that are high in vitamin C. Foods that are high in vitamin C include: ? Oranges. ? Peppers. ? Tomatoes. ? Mangoes.  Drinkenoughfluid to keep your urine clear or pale yellow. General instructions  Return to your normal activities as told by your health care provider. Ask your health care provider what activities are safe for you.  Practice good hygiene. Anemia can make you more prone to illness and infection.  Keep all follow-up visits as told by your health care provider. This is important. Contact a health care provider if:  You feel nauseous or you vomit.  You feel weak.  You have unexplained sweating.  You develop symptoms of constipation, such as: ? Having fewer than three bowel movements a week. ? Straining to have a bowel movement. ? Having stools that are hard, dry, or larger than normal. ? Feeling full or bloated. ? Pain in the lower abdomen. ? Not feeling relief after having a bowel movement. Get help right away if:  You faint. If this happens, do not drive yourself to the hospital. Call your local emergency services (911 in the [place]  You have chest  pain.  You have shortness of breath that: ? Is severe. ? Gets worse with physical activity.  You have a rapid heartbeat.  You become light-headed when getting up from a sitting or lying down position. This information is not intended to replace advice given to you by your health care provider. Make sure you discuss any questions you have with your health care provider. Document Released: 07/12/2000 Document Revised: 04/03/2016 Document Reviewed: 04/03/2016 Elsevier Interactive Patient Education  2018 [employer].

## 2017-03-18 ENCOUNTER — Encounter: Payer: BLUE CROSS/BLUE SHIELD | Admitting: Family Medicine

## 2017-03-19 NOTE — Progress Notes (Signed)
I have reviewed this encounter including the documentation in this note and/or discussed this patient with the provider, Cassell Smiles, AGPCNP-BC. I am certifying that I agree with the content of this note as supervising physician.  Nobie Putnam, [facility] 03/19/2017, 8:11 AM

## 2017-03-20 ENCOUNTER — Other Ambulatory Visit: Payer: Self-pay

## 2017-03-20 ENCOUNTER — Other Ambulatory Visit: Payer: Self-pay | Admitting: Nurse Practitioner

## 2017-03-20 DIAGNOSIS — D509 Iron deficiency anemia, unspecified: Secondary | ICD-10-CM | POA: Diagnosis not present

## 2017-03-21 LAB — CBC WITH DIFFERENTIAL/PLATELET
Basophils Absolute: 66 cells/uL (ref 0–200)
Basophils Relative: 1 %
Eosinophils Absolute: 198 cells/uL (ref 15–500)
Eosinophils Relative: 3 %
HCT: 36 % (ref 35.0–45.0)
Hemoglobin: 11 g/dL — ABNORMAL LOW (ref 11.7–15.5)
Lymphocytes Relative: 33 %
Lymphs Abs: 2178 cells/uL (ref 850–3900)
MCH: 24.8 pg — ABNORMAL LOW (ref 27.0–33.0)
MCHC: 30.6 g/dL — ABNORMAL LOW (ref 32.0–36.0)
MCV: 81.3 fL (ref 80.0–100.0)
MPV: 8.2 fL (ref 7.5–12.5)
Monocytes Absolute: 462 cells/uL (ref 200–950)
Monocytes Relative: 7 %
Neutro Abs: 3696 cells/uL (ref 1500–7800)
Neutrophils Relative %: 56 %
Platelets: 506 10*3/uL — ABNORMAL HIGH (ref 140–400)
RBC: 4.43 MIL/uL (ref 3.80–5.10)
RDW: 18.2 % — ABNORMAL HIGH (ref 11.0–15.0)
WBC: 6.6 10*3/uL (ref 3.8–10.8)

## 2017-03-21 LAB — IRON,TIBC AND FERRITIN PANEL
%SAT: 52 % — ABNORMAL HIGH (ref 11–50)
Ferritin: 15 ng/mL (ref 10–232)
Iron: 193 ug/dL — ABNORMAL HIGH (ref 40–190)
TIBC: 374 ug/dL (ref 250–450)

## 2017-03-26 ENCOUNTER — Ambulatory Visit
Admission: RE | Admit: 2017-03-26 | Discharge: 2017-03-26 | Disposition: A | Discharge status: Home / Self Care | Payer: BLUE CROSS/BLUE SHIELD | Source: Ambulatory Visit | Attending: Nurse Practitioner | Admitting: Nurse Practitioner

## 2017-03-26 DIAGNOSIS — N946 Dysmenorrhea, unspecified: Secondary | ICD-10-CM | POA: Insufficient documentation

## 2017-03-26 DIAGNOSIS — N83201 Unspecified ovarian cyst, right side: Secondary | ICD-10-CM | POA: Insufficient documentation

## 2017-03-26 DIAGNOSIS — N852 Hypertrophy of uterus: Secondary | ICD-10-CM | POA: Diagnosis not present

## 2017-03-27 ENCOUNTER — Other Ambulatory Visit: Payer: Self-pay | Admitting: Nurse Practitioner

## 2017-03-27 DIAGNOSIS — N926 Irregular menstruation, unspecified: Secondary | ICD-10-CM

## 2017-03-27 DIAGNOSIS — H938X1 Other specified disorders of right ear: Secondary | ICD-10-CM | POA: Diagnosis not present

## 2017-03-27 DIAGNOSIS — H9311 Tinnitus, right ear: Secondary | ICD-10-CM | POA: Diagnosis not present

## 2017-03-27 DIAGNOSIS — D259 Leiomyoma of uterus, unspecified: Secondary | ICD-10-CM

## 2017-03-27 DIAGNOSIS — D649 Anemia, unspecified: Secondary | ICD-10-CM

## 2017-03-27 LAB — HGB ELECTROPHORESIS REFLEXED REPORT
Hemoglobin A - HGBRFX: 97.7 % (ref >96.0)
Hemoglobin A2 - HGBRFX: 2.3 % (ref 1.8–3.5)
Hemoglobin F - HGBRFX: 0 % (ref <2.0)

## 2017-03-28 ENCOUNTER — Encounter: Payer: Self-pay | Admitting: Nurse Practitioner

## 2017-04-04 ENCOUNTER — Encounter: Payer: Self-pay | Admitting: Nurse Practitioner

## 2017-04-04 ENCOUNTER — Ambulatory Visit (INDEPENDENT_AMBULATORY_CARE_PROVIDER_SITE_OTHER): Payer: BLUE CROSS/BLUE SHIELD | Admitting: Nurse Practitioner

## 2017-04-04 VITALS — BP 104/68 | HR 98 | Temp 98.5°F | Resp 12 | Wt 124.6 lb

## 2017-04-04 DIAGNOSIS — D259 Leiomyoma of uterus, unspecified: Secondary | ICD-10-CM | POA: Diagnosis not present

## 2017-04-04 DIAGNOSIS — Z124 Encounter for screening for malignant neoplasm of cervix: Secondary | ICD-10-CM | POA: Diagnosis not present

## 2017-04-04 NOTE — Assessment & Plan Note (Signed)
Uterine fibroid palpated on bimanual exam w/o tenderness.  Confirmed presence on [place] and is likely cause of dysmenorrhea.   Plan: 1. Discussed w/ pt that GYN is next steps.  Referral appointment not established. - May consider short-term hormonal contraception for cycle regulation.  Likely will need surgical intervention to resolve dysmenorrhea. 2. Followup GYN.

## 2017-04-04 NOTE — Patient Instructions (Addendum)
Tasha Simmons, Thank you for coming in to clinic today.  1. For your uterine fibroids: - Continue your follow up appointment w/ OB-GYN. - Consider taking a multivitamin, but you do not need regular iron supplements. - If your anemia is not resolved by correcting your menstrual cycle, we can do a detailed blood test for traits that can cause the changes we see for your red blood cells.  This will not change our care for you, but could be important for children or grandchildren.   Please schedule a follow-up appointment with Cassell Smiles, AGNP. Return if symptoms worsen or fail to improve, for and in 1 year for your next physical exam w/ pap.  If you have any other questions or concerns, please feel free to call the clinic or send a message through [place]. You may also schedule an earlier appointment if necessary.  You will receive a survey after today's visit either digitally by e-mail or paper by [employer]. Your experiences and feedback matter to [place].  Please respond so we know how we are doing as we provide care for you.   Cassell Smiles, DNP, AGNP-BC Adult Gerontology Nurse Practitioner [facility], [facility]   Dysfunctional Uterine Bleeding Dysfunctional uterine bleeding is abnormal bleeding from the uterus. Dysfunctional uterine bleeding includes:  A period that comes earlier or later than usual.  A period that is lighter, heavier, or has blood clots.  Bleeding between periods.  Skipping one or more periods.  Bleeding after sexual intercourse.  Bleeding after menopause.  Follow these instructions at home: Pay attention to any changes in your symptoms. Follow these instructions to help with your condition: Eating and drinking  Eat well-balanced meals. Include foods that are high in iron, such as liver, meat, shellfish, green leafy vegetables, and eggs.  If you become constipated: ? Drink plenty of water. ? Eat fruits and vegetables that are high in water and fiber,  such as spinach, carrots, raspberries, apples, and mango. Medicines  Take over-the-counter and prescription medicines only as told by your health care provider.  Do not change medicines without talking with your health care provider.  Aspirin or medicines that contain aspirin may make the bleeding worse. Do not take those medicines: ? During the week before your period. ? During your period.  If you were prescribed iron pills, take them as told by your health care provider. Iron pills help to replace iron that your body loses because of this condition. Activity  If you need to change your sanitary pad or tampon more than one time every 2 hours: ? Lie in bed with your feet raised (elevated). ? Place a cold pack on your lower abdomen. ? Rest as much as possible until the bleeding stops or slows down.  Do not try to lose weight until the bleeding has stopped and your blood iron level is back to normal. Other Instructions  For two months, write down: ? When your period starts. ? When your period ends. ? When any abnormal bleeding occurs. ? What problems you notice.  Keep all follow up visits as told by your health care provider. This is important. Contact a health care provider if:  You get light-headed or weak.  You have nausea and vomiting.  You cannot eat or drink without vomiting.  You feel dizzy or have diarrhea while you are taking medicines.  You are taking birth control pills or hormones, and you want to change them or stop taking them. Get help right  away if:  You develop a fever or chills.  You need to change your sanitary pad or tampon more than one time per hour.  Your bleeding becomes heavier, or your flow contains clots more often.  You develop pain in your abdomen.  You lose consciousness.  You develop a rash. This information is not intended to replace advice given to you by your health care provider. Make sure you discuss any questions you have with  your health care provider. Document Released: 07/12/2000 Document Revised: 12/21/2015 Document Reviewed: 10/10/2014 Elsevier Interactive Patient Education  2018 Elsevier In

## 2017-04-04 NOTE — Progress Notes (Signed)
Subjective:    Patient ID: Tasha Simmons, female    DOB: [birth year], 46 y.o.   MRN: [id number]  Tasha Simmons is a 46 y.o. female presenting on 04/04/2017 for Pap only and review of labs.  She was actively menstruating at last visit.  HPI Cervical Cancer Screening Pt has been doing well since last visit.  Reviewed lab results in clinic for anemia.  Pt now has no additional questions. - Prior HPV positive on PAP 1 year ago. Is now following annual PAP screening.  Patient's last menstrual period was 03/17/2017 (exact date).   Uterine Fibroids Has had diagnosed uterine fibroids on [place] after last visit.   No bleeding for last 2 weeks.  Pt has not yet gotten appointment with [facility] for consultation.   Social History  Substance Use Topics  . Smoking status: Former Smoker    Packs/day: 1.00    Years: 13.00    Quit date: 12/15/2016  . Smokeless tobacco: Never Used     Comment: Still battling cravings  . Alcohol use No    Review of Systems Per HPI unless specifically indicated above     Objective:    BP 104/68 (BP Location: Left Arm, Patient Position: Sitting, Cuff Size: Normal)   Pulse 98   Temp 98.5 F (36.9 C) (Oral)   Resp 12   Wt 124 lb 9.6 oz (56.5 kg)   LMP 03/17/2017 (Exact Date)   SpO2 100%   BMI 23.54 kg/m   Wt Readings from Last 3 Encounters:  04/04/17 124 lb 9.6 oz (56.5 kg)  03/17/17 124 lb 9.6 oz (56.5 kg)  01/14/17 121 lb (54.9 kg)    Physical Exam General - healthy, well-appearing, NAD HEENT - Normocephalic, atraumatic Heart - RRR, no murmurs heard Lungs - Clear throughout all lobes, no wheezing, crackles, or rhonchi. Normal work of breathing. GU - Normal external female genitalia without lesions or fusion. Vaginal canal without lesions. Normal appearing cervix without lesions or friability. Physiologic discharge on exam. Bimanual exam without adnexal masses or cervical motion tenderness.  Enlarged uterus noted w/ firm mass palpated. Extremeties -  non-tender, no edema, cap refill < 2 seconds, peripheral pulses intact +2 bilaterally Skin - warm, dry, no rashes Neuro - awake, alert, oriented x3, intact muscle strength 5/5 bilaterally, intact distal sensation to light touch, normal coordination, normal gait Psych - Normal mood and affect, normal behavior    Results for orders placed or performed in visit on 03/20/17  Hemoglobin electrophoresis  Result Value Ref Range   Hemoglobin A - HGBRFX 97.7 >96.0 %   Hemoglobin F - HGBRFX 0.0 <2.0 %   Hemoglobin A2 - HGBRFX 2.3 1.8 - 3.5 %   Interpretation - HGBRFX REPORT       Assessment & Plan:   Problem List Items Addressed This Visit      Genitourinary   Uterine fibroid    Uterine fibroid palpated on bimanual exam w/o tenderness.  Confirmed presence on [place] and is likely cause of dysmenorrhea.   Plan: 1. Discussed w/ pt that GYN is next steps.  Referral appointment not established. - May consider short-term hormonal contraception for cycle regulation.  Likely will need surgical intervention to resolve dysmenorrhea. 2. Followup GYN.       Other Visit Diagnoses    Encounter for screening for cervical cancer     -  Primary Normal exam except uterus as above.  Pt previously HPV positive.  Plan: 1. PAP w/ HPV. 2. Follow  up 1 year if continues to be HPV positive and has no surgical removal of cervix.   Relevant Orders   PAP, Thin Prep w/HPV rflx HPV Type 16/18        Follow up plan: Return if symptoms worsen or fail to improve, for and in 1 year for your next physical exam w/ pap.   Cassell Smiles, DNP, AGPCNP-BC Adult Gerontology Primary Care Nurse Practitioner [facility] 04/04/2017, 1:14 PM

## 2017-04-07 DIAGNOSIS — Z124 Encounter for screening for malignant neoplasm of cervix: Secondary | ICD-10-CM | POA: Diagnosis not present

## 2017-04-10 ENCOUNTER — Other Ambulatory Visit: Payer: Self-pay | Admitting: Nurse Practitioner

## 2017-04-10 ENCOUNTER — Other Ambulatory Visit: Payer: Self-pay

## 2017-04-10 DIAGNOSIS — H9042 Sensorineural hearing loss, unilateral, left ear, with unrestricted hearing on the contralateral side: Secondary | ICD-10-CM | POA: Diagnosis not present

## 2017-04-10 DIAGNOSIS — R42 Dizziness and giddiness: Secondary | ICD-10-CM | POA: Diagnosis not present

## 2017-04-10 DIAGNOSIS — A5901 Trichomonal vulvovaginitis: Secondary | ICD-10-CM

## 2017-04-10 DIAGNOSIS — N6002 Solitary cyst of left breast: Secondary | ICD-10-CM

## 2017-04-10 LAB — PAP, TP IMAGING W/ HPV RNA, RFLX HPV TYPE 16,18/45: HPV DNA High Risk: DETECTED — AB

## 2017-04-10 LAB — HPV TYPE 16 AND 18/45 RNA
HPV Type 16 RNA: NOT DETECTED
HPV Type 18/45 RNA: NOT DETECTED

## 2017-04-10 MED ORDER — METRONIDAZOLE 500 MG PO TABS: 1000.0000 mg | ORAL_TABLET | Freq: Two times a day (BID) | ORAL | 0 refills | Status: AC

## 2017-05-09 ENCOUNTER — Other Ambulatory Visit: Payer: BLUE CROSS/BLUE SHIELD

## 2017-05-13 ENCOUNTER — Other Ambulatory Visit: Payer: Self-pay

## 2017-05-19 ENCOUNTER — Other Ambulatory Visit: Payer: Self-pay

## 2017-05-19 DIAGNOSIS — K219 Gastro-esophageal reflux disease without esophagitis: Secondary | ICD-10-CM

## 2017-05-19 MED ORDER — RANITIDINE HCL 150 MG PO TABS: 150.0000 mg | ORAL_TABLET | Freq: Two times a day (BID) | ORAL | 5 refills | Status: DC

## 2017-05-19 NOTE — Telephone Encounter (Signed)
Prescription request from Tasha Simmons for Zantac. This prescription was prescribed to the pt from Amy Krebs. Please advise

## 2017-05-21 ENCOUNTER — Encounter: Payer: BLUE CROSS/BLUE SHIELD | Admitting: Obstetrics and Gynecology

## 2017-06-06 ENCOUNTER — Ambulatory Visit
Admission: RE | Admit: 2017-06-06 | Discharge: 2017-06-06 | Disposition: A | Discharge status: Home / Self Care | Payer: BLUE CROSS/BLUE SHIELD | Source: Ambulatory Visit | Attending: Nurse Practitioner | Admitting: Nurse Practitioner

## 2017-06-06 DIAGNOSIS — N6322 Unspecified lump in the left breast, upper inner quadrant: Secondary | ICD-10-CM | POA: Diagnosis not present

## 2017-06-06 DIAGNOSIS — N6002 Solitary cyst of left breast: Secondary | ICD-10-CM | POA: Diagnosis not present

## 2017-06-06 DIAGNOSIS — R922 Inconclusive mammogram: Secondary | ICD-10-CM | POA: Diagnosis not present

## 2017-06-06 DIAGNOSIS — N6489 Other specified disorders of breast: Secondary | ICD-10-CM | POA: Diagnosis not present

## 2017-06-16 ENCOUNTER — Encounter: Payer: BLUE CROSS/BLUE SHIELD | Admitting: Obstetrics and Gynecology

## 2017-06-16 ENCOUNTER — Other Ambulatory Visit: Payer: Self-pay | Admitting: Nurse Practitioner

## 2017-06-16 ENCOUNTER — Other Ambulatory Visit: Payer: BLUE CROSS/BLUE SHIELD

## 2017-06-16 DIAGNOSIS — D509 Iron deficiency anemia, unspecified: Secondary | ICD-10-CM

## 2017-06-16 DIAGNOSIS — D6489 Other specified anemias: Secondary | ICD-10-CM

## 2017-06-17 ENCOUNTER — Encounter: Payer: Self-pay | Admitting: Nurse Practitioner

## 2017-06-17 ENCOUNTER — Other Ambulatory Visit: Payer: Self-pay | Admitting: Nurse Practitioner

## 2017-06-17 DIAGNOSIS — D473 Essential (hemorrhagic) thrombocythemia: Secondary | ICD-10-CM

## 2017-06-17 DIAGNOSIS — D75839 Thrombocytosis, unspecified: Secondary | ICD-10-CM | POA: Insufficient documentation

## 2017-06-18 ENCOUNTER — Ambulatory Visit: Payer: BLUE CROSS/BLUE SHIELD | Admitting: Nurse Practitioner

## 2017-06-20 LAB — CBC WITH DIFFERENTIAL/PLATELET
Basophils Absolute: 82 cells/uL (ref 0–200)
Basophils Relative: 1 %
Eosinophils Absolute: 131 cells/uL (ref 15–500)
Eosinophils Relative: 1.6 %
HCT: 40 % (ref 35.0–45.0)
Hemoglobin: 13.2 g/dL (ref 11.7–15.5)
Lymphs Abs: 2165 cells/uL (ref 850–3900)
MCH: 28.4 pg (ref 27.0–33.0)
MCHC: 33 g/dL (ref 32.0–36.0)
MCV: 86 fL (ref 80.0–100.0)
MPV: 9.1 fL (ref 7.5–12.5)
Monocytes Relative: 5.7 %
Neutro Abs: 5355 cells/uL (ref 1500–7800)
Neutrophils Relative %: 65.3 %
Platelets: 483 10*3/uL — ABNORMAL HIGH (ref 140–400)
RBC: 4.65 10*6/uL (ref 3.80–5.10)
RDW: 13.4 % (ref 11.0–15.0)
Total Lymphocyte: 26.4 %
WBC mixed population: 467 cells/uL (ref 200–950)
WBC: 8.2 10*3/uL (ref 3.8–10.8)

## 2017-06-20 LAB — TEST AUTHORIZATION

## 2017-06-20 LAB — HEMOGLOBINOPATHY EVALUATION
Fetal Hemoglobin Testing: <1 % (ref 0.0–1.9)
HCT: 42.1 % (ref 35.0–45.0)
Hemoglobin A2 - HGBRFX: 2.4 % (ref 1.8–3.5)
Hemoglobin: 13.4 g/dL (ref 11.7–15.5)
Hgb A: 96.6 % (ref >96.0)
MCH: 28.9 pg (ref 27.0–33.0)
MCV: 90.9 fL (ref 80.0–100.0)
RBC: 4.63 10*6/uL (ref 3.80–5.10)
RDW: 13.4 % (ref 11.0–15.0)

## 2017-06-24 ENCOUNTER — Other Ambulatory Visit: Payer: Self-pay

## 2017-06-24 ENCOUNTER — Ambulatory Visit (INDEPENDENT_AMBULATORY_CARE_PROVIDER_SITE_OTHER): Payer: BLUE CROSS/BLUE SHIELD | Admitting: Nurse Practitioner

## 2017-06-24 ENCOUNTER — Encounter: Payer: Self-pay | Admitting: Nurse Practitioner

## 2017-06-24 VITALS — BP 119/77 | HR 80 | Temp 97.8°F | Ht 61.0 in | Wt 131.8 lb

## 2017-06-24 DIAGNOSIS — D75839 Thrombocytosis, unspecified: Secondary | ICD-10-CM

## 2017-06-24 DIAGNOSIS — D508 Other iron deficiency anemias: Secondary | ICD-10-CM | POA: Diagnosis not present

## 2017-06-24 DIAGNOSIS — D473 Essential (hemorrhagic) thrombocythemia: Secondary | ICD-10-CM | POA: Diagnosis not present

## 2017-06-24 NOTE — Progress Notes (Signed)
Subjective:    Patient ID: Tasha Simmons, female    DOB: 05-23-1971, 46 y.o.   MRN: [id number]  Tasha Simmons is a 46 y.o. female presenting on 06/24/2017 for Anemia   HPI  Anemia Followup - Labs reviewed and show resolution of anemia. - Pt is still waiting for GYN appointment to address her uterine fibroids. - Pt has questions about need for B12 and Iron supplementation.  Currently she is taking none, only watching her diet. - Pt also has not heard about hematology for thrombocytosis.  Reviewed CBCs for last 3 years as available in Epic and all CBCs have had Platelet count > 450 despite presence or absence of anemia.  Pt is not currently symptomatic.  Has had no s/sx of DVT in legs or arms.  Denies any unilateral leg or arm swelling w/ localized areas of redness/warmth.  She also denies s/sx of PE, MI, CVA to include chest pain, pain on inspiration, chest tightness/pressure, palpitations, shortness of breath, sudden loss of speech/loss of consciousness, leg or arm weakness, or slurred speech.  Social History   Tobacco Use  . Smoking status: Former Smoker    Packs/day: 1.00    Years: 13.00    Pack years: 13.00    Last attempt to quit: 12/15/2016    Years since quitting: 0.5  . Smokeless tobacco: Never Used  . Tobacco comment: Still battling cravings  Substance Use Topics  . Alcohol use: No  . Drug use: No    Review of Systems Per HPI unless specifically indicated above     Objective:    BP 119/77 (BP Location: Right Arm, Patient Position: Sitting, Cuff Size: Normal)   Pulse 80   Temp 97.8 F (36.6 C) (Oral)   Ht 5\' 1"  (1.549 m)   Wt 131 lb 12.8 oz (59.8 kg)   LMP 06/03/2017   BMI 24.90 kg/m   Wt Readings from Last 3 Encounters:  06/24/17 131 lb 12.8 oz (59.8 kg)  04/04/17 124 lb 9.6 oz (56.5 kg)  03/17/17 124 lb 9.6 oz (56.5 kg)    Physical Exam  Constitutional: She is oriented to person, place, and time. She appears well-developed and well-nourished. No  distress.  HENT:  Head: Normocephalic and atraumatic.  Mouth/Throat: Uvula is midline, oropharynx is clear and moist and mucous membranes are normal.  Normal tongue appearance  Cardiovascular: Normal rate, regular rhythm and intact distal pulses.  Neurological: She is alert and oriented to person, place, and time.  Skin: Skin is warm and dry.  Normal appearance of nails and nail beds  Psychiatric: She has a normal mood and affect. Her behavior is normal.  Vitals reviewed.    Results for orders placed or performed in visit on 06/16/17  CBC with Differential/Platelet  Result Value Ref Range   WBC 8.2 3.8 - 10.8 Thousand/uL   RBC 4.65 3.80 - 5.10 Million/uL   Hemoglobin 13.2 11.7 - 15.5 g/dL   HCT 40.0 35.0 - 45.0 %   MCV 86.0 80.0 - 100.0 fL   MCH 28.4 27.0 - 33.0 pg   MCHC 33.0 32.0 - 36.0 g/dL   RDW 13.4 11.0 - 15.0 %   Platelets 483 (H) 140 - 400 Thousand/uL   MPV 9.1 7.5 - 12.5 fL   Neutro Abs 5,355 1,500 - 7,800 cells/uL   Lymphs Abs 2,165 850 - 3,900 cells/uL   WBC mixed population 467 200 - 950 cells/uL   Eosinophils Absolute 131 15 - 500 cells/uL  Basophils Absolute 82 0 - 200 cells/uL   Neutrophils Relative % 65.3 %   Total Lymphocyte 26.4 %   Monocytes Relative 5.7 %   Eosinophils Relative 1.6 %   Basophils Relative 1.0 %  Hemoglobinopathy Evaluation  Result Value Ref Range   RBC 4.63 3.80 - 5.10 Million/uL   Hemoglobin 13.4 11.7 - 15.5 g/dL   HCT 42.1 35.0 - 45.0 %   MCV 90.9 80.0 - 100.0 fL   MCH 28.9 27.0 - 33.0 pg   RDW 13.4 11.0 - 15.0 %   Hgb A 96.6 >96.0 %   Fetal Hemoglobin Testing <1.0 0.0 - 1.9 %   Hemoglobin A2 - HGBRFX 2.4 1.8 - 3.5 %   Interpretation    TEST AUTHORIZATION  Result Value Ref Range   TEST NAME: HEMOGLOBINOPATHY EVALUATION    TEST CODE: [id number]    CLIENT CONTACT: Cassell Smiles    REPORT ALWAYS MESSAGE SIGNATURE        Assessment & Plan:   Problem List Items Addressed This Visit      Hematopoietic and Hemostatic    Thrombocytosis ([place]) - Primary    Pt w/ at least 3 year history of thrombocytosis and plt count > 450. Pt also w/ anemia, but is currently resolved and has persistently elevated platelet count.  Best practice is to have additional workup for possible causes of thrombocytopenia.  No current or prior s/sx of DVT, PE, MI, CVA.  Plan: 1. Referral placed for hematology on 06/17/17 2. Discussed w/ pt option of waiting until after uterine fibroid workup and treatment for hematology referral if thrombocytopenia persists 3 weeks after possible surgery. 3. Pt will think about her options and decide which she prefers after today's appointment.  Hematology referral remains in place today. 4. Followup as needed in next 3 months and in August 2019 for annual physical.        Other   Iron deficiency anemia    Resolved on most recent blood studies and pt is asymptomatic today w/o changes of chronic anemia on clinical exam.  Plan:    - Discussed lab results with patient.   All questions answered and pt verbalizes understanding. - Encouraged continuing to eat diet high in iron and B12.  May take multivitamin if desired, but no need for additional supplementation. - continue at least annual monitoring of CBC w/ differential.          Follow up plan: Return in about 9 months (around 03/23/2018), or if symptoms worsen or fail to improve, for Annual physical.  Cassell Smiles, DNP, AGPCNP-BC Adult Gerontology Primary Care Nurse Practitioner [facility] 06/24/2017, 8:42 AM

## 2017-06-24 NOTE — Assessment & Plan Note (Addendum)
Pt w/ at least 3 year history of thrombocytosis and plt count > 450. Pt also w/ anemia, but is currently resolved and has persistently elevated platelet count.  Best practice is to have additional workup for possible causes of thrombocytopenia.  No current or prior s/sx of DVT, PE, MI, CVA.  Plan: 1. Referral placed for hematology on 06/17/17 2. Discussed w/ pt option of waiting until after uterine fibroid workup and treatment for hematology referral if thrombocytopenia persists 3 weeks after possible surgery. 3. Pt will think about her options and decide which she prefers after today's appointment.  Hematology referral remains in place today. 4. Followup as needed in next 3 months and in August 2019 for annual physical.

## 2017-06-24 NOTE — Assessment & Plan Note (Addendum)
Resolved on most recent blood studies and pt is asymptomatic today w/o changes of chronic anemia on clinical exam.  Plan:    - Discussed lab results with patient.   All questions answered and pt verbalizes understanding. - Encouraged continuing to eat diet high in iron and B12.  May take multivitamin if desired, but no need for additional supplementation. - continue at least annual monitoring of CBC w/ differential.

## 2017-06-24 NOTE — Patient Instructions (Addendum)
Myrah, Thank you for coming in to clinic today.  1. For your blood counts: - Your anemia is resolved.  Continue eating a good diet.  You can take a multivitamin if you would like, but you do not need any additional supplementation.  - You still have an elevated platelet count and you have had this for at least 3 years. It is best to have at least one visit with hematology for additional evaluation.  Ask your GYN if your fibroids and heavy periods may be a possible cause.  If so, you can wait on hematology until after discussing and completing treatment for your fibroids.   Please schedule a follow-up appointment with Cassell Smiles, AGNP. Return in about 9 months (around 03/23/2018), or if symptoms worsen or fail to improve, for Annual physical.  If you have any other questions or concerns, please feel free to call the clinic or send a message through [place]. You may also schedule an earlier appointment if necessary.  You will receive a survey after today's visit either digitally by e-mail or paper by [employer]. Your experiences and feedback matter to [place].  Please respond so we know how we are doing as we provide care for you.   Cassell Smiles, DNP, AGNP-BC Adult Gerontology Nurse Practitioner [place]

## 2017-07-01 ENCOUNTER — Encounter: Payer: Self-pay | Admitting: Obstetrics and Gynecology

## 2017-07-01 ENCOUNTER — Ambulatory Visit: Payer: BLUE CROSS/BLUE SHIELD | Admitting: Obstetrics and Gynecology

## 2017-07-01 VITALS — BP 121/75 | HR 83 | Ht 61.0 in | Wt 132.1 lb

## 2017-07-01 DIAGNOSIS — D500 Iron deficiency anemia secondary to blood loss (chronic): Secondary | ICD-10-CM

## 2017-07-01 DIAGNOSIS — D251 Intramural leiomyoma of uterus: Secondary | ICD-10-CM | POA: Diagnosis not present

## 2017-07-01 DIAGNOSIS — N920 Excessive and frequent menstruation with regular cycle: Secondary | ICD-10-CM | POA: Diagnosis not present

## 2017-07-01 NOTE — Progress Notes (Signed)
HPI:      [name] is a 46 y.o. [id number] who LMP was Patient's last menstrual period was 06/03/2017 (exact date).  Subjective:   She presents today because she has large uterine fibroids and has very heavy menstrual periods which have caused her to have chronic blood loss anemia.  She is taking iron daily to try to improve her blood counts.  She also reports occasional urine loss when her bladder is filled.  She does have cramping with her menses but this does not prevent her from working.    Hx: The following portions of the patient's history were reviewed and updated as appropriate:             She  has a past medical history of Acid reflux, Anemia, and Benign breast cyst in female. She does not have any pertinent problems on file. She  has a past surgical history that includes Back surgery; Colonoscopy with propofol (N/A, 06/28/2016); Esophagogastroduodenoscopy (egd) with propofol (N/A, 06/28/2016); and Breast biopsy (Left, 7 years ago). Her family history includes Heart disease in her father and maternal grandmother; Hyperlipidemia in her father and mother. She  reports that she quit smoking about 6 months ago. She has a 13.00 pack-year smoking history. she has never used smokeless tobacco. She reports that she does not drink alcohol or use drugs. She has No Known Allergies.       Review of Systems:  Review of Systems  Constitutional: Denied constitutional symptoms, night sweats, recent illness, fatigue, fever, insomnia and weight loss.  Eyes: Denied eye symptoms, eye pain, photophobia, vision change and visual disturbance.  Ears/Nose/Throat/Neck: Denied ear, nose, throat or neck symptoms, hearing loss, nasal discharge, sinus congestion and sore throat.  Cardiovascular: Denied cardiovascular symptoms, arrhythmia, chest pain/pressure, edema, exercise intolerance, orthopnea and palpitations.  Respiratory: Denied pulmonary symptoms, asthma, pleuritic pain, productive sputum, cough,  dyspnea and wheezing.  Gastrointestinal: Denied, gastro-esophageal reflux, melena, nausea and vomiting.  Genitourinary: See HPI for additional information.  Musculoskeletal: Denied musculoskeletal symptoms, stiffness, swelling, muscle weakness and myalgia.  Dermatologic: Denied dermatology symptoms, rash and scar.  Neurologic: Denied neurology symptoms, dizziness, headache, neck pain and syncope.  Psychiatric: Denied psychiatric symptoms, anxiety and depression.  Endocrine: Denied endocrine symptoms including hot flashes and night sweats.   Meds:   Current Outpatient Medications on File Prior to Visit  Medication Sig Dispense Refill  . IRON, FERROUS SULFATE, PO Take by mouth.    Marland Kitchen CARAFATE 1 GM/10ML suspension TAKE 10ML BY MOUTH 4 TIMES DAILY WITH MEALS AND AT BEDTIME 1500 mL 0   No current facility-administered medications on file prior to visit.     Objective:     Vitals:   07/01/17 1040  BP: 121/75  Pulse: 83              Physical examination   Pelvic:   Vulva: Normal appearance.  No lesions.  Vagina: No lesions or abnormalities noted.  Support: Normal pelvic support.  Urethra No masses tenderness or scarring.  Meatus Normal size without lesions or prolapse.  Cervix: Normal appearance.  No lesions.  Anus: Normal exam.  No lesions.  Perineum: Normal exam.  No lesions.        Bimanual   Uterus:  16 weeks non-tender.  Mobile.  AV.  Adnexae: No masses.  Non-tender to palpation.  Cul-de-sac: Negative for abnormality.     Assessment:    [id number] Patient Active Problem List   Diagnosis Date Noted  . Thrombocytosis ([place])  06/17/2017  . Uterine fibroid 04/04/2017  . 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 Heart murmur, systolic 01/22/9484  . Hyperlipidemia 09/09/2016  . Duodenitis   . Polyp of duodenum   . 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 Iron deficiency anemia 02/12/2016  . Benign cyst of left breast 07/07/2015  .  Gastroesophageal reflux disease without esophagitis 07/07/2015     1. Fibroids, intramural   2. Chronic blood loss anemia   3. Menorrhagia with regular cycle     Patient tired of bleeding so heavily and being anemic.  She is requesting definitive surgery.   Plan:            1.  We discussed the risk and benefits of surgery versus medical treatment or IUD for chronic blood loss anemia.  2.  Fibroids Uterine fibroids were discussed in detail.  The natural course and history of fibroids were reviewed.  Multiple treatment options were also discussed including NSAIDS, hormonal options and hysterectomy.  Menopause and its effect on fibroids was also reviewed. 3.  Types of surgery and risk benefits of each were discussed in detail.  4.  Although the uterus is large I think it may be possible to perform LAVH with vaginal uterine morcellation or bivalve.  The possibility of perineal incision discussed with patient.  Conversion to possible abdominal hysterectomy discussed with patient. 5.  She will follow-up when she has reached a decision regarding timing of her surgery. Orders No orders of the defined types were placed in this encounter.   No orders of the defined types were placed in this encounter.     F/U  No Follow-up on file. I spent 35 minutes with this patient of which greater than 50% was spent discussing uterine fibroids, medical and surgical treatments, hospital stay, follow-up, menopause and ovaries, different types of surgery for hysterectomy.  Finis Bud, M.D. 07/01/2017 11:30 AM

## 2017-07-30 ENCOUNTER — Inpatient Hospital Stay: Payer: BLUE CROSS/BLUE SHIELD | Attending: Hematology and Oncology | Admitting: Hematology and Oncology

## 2017-07-30 NOTE — Progress Notes (Deleted)
[facility] day:  07/30/2017  Chief Complaint: Tasha Simmons is a 47 y.o. female with thrombocytosis who is referred in consultation by Cassell Smiles, NP for assessment and management.  HPI: ***  Labs dating back to 07/14/2015 reveal a platelet count ranging from 479,000 - 587,000.  CBC on 07/14/2015 revealed a hematocrit of 29.8, hemoglobin 9.2, MCV 67, platelets 587,000, WBC 11,300, and ANC of 6600.  She has not been anemic since 02/12/2016.  RBCs were microcytic on 03/10/2017.  CBC on 06/16/2017 revealed a hematocrit of 40, hemoglobin 13.2, MCV 86, platelets 483,000, a dn WBC 8200 with an [place] of 5355.  Differential included 65% segs, 6% monocytes, 2% eosinophils, and 2% basophils.  Ferritin was 7, iron saturation 2%, and TIBC 440 on 12/16/206.  Ferritin was 25 with an iron saturation of 62% and a TIBC of 344 on 02/12/2016.  Ferritin was 15, iron saturation 52% and TIBC 374 on 03/20/2017.  Hemoglobin electrophoresis on 03/20/2017 was normal (97.7% Hgb A, 2.3% Hgb A2).   Past Medical History:  Diagnosis Date  . Acid reflux   . Anemia   . Benign breast cyst in female    5 years ago.     Past Surgical History:  Procedure Laterality Date  . BACK SURGERY    . BREAST BIOPSY Left 7 years ago   Benign no scar  . COLONOSCOPY WITH PROPOFOL N/A 06/28/2016   Procedure: COLONOSCOPY WITH PROPOFOL;  Surgeon: Jonathon Bellows, MD;  Location: [facility] ENDOSCOPY;  Service: Endoscopy;  Laterality: N/A;  . ESOPHAGOGASTRODUODENOSCOPY (EGD) WITH PROPOFOL N/A 06/28/2016   Procedure: ESOPHAGOGASTRODUODENOSCOPY (EGD) WITH PROPOFOL;  Surgeon: Jonathon Bellows, MD;  Location: [facility] ENDOSCOPY;  Service: Endoscopy;  Laterality: N/A;    Family History  Problem Relation Age of Onset  . Hyperlipidemia Mother   . Heart disease Father   . Hyperlipidemia Father   . Heart disease Maternal Grandmother   . Breast cancer Neg Hx     Social History:  reports that she quit smoking about 7  months ago. She has a 13.00 pack-year smoking history. she has never used smokeless tobacco. She reports that she does not drink alcohol or use drugs.  The patient is accompanied by *** alone today.  Allergies: No Known Allergies  Current Medications: Current Outpatient Medications  Medication Sig Dispense Refill  . CARAFATE 1 GM/10ML suspension TAKE 10ML BY MOUTH 4 TIMES DAILY WITH MEALS AND AT BEDTIME 1500 mL 0  . IRON, FERROUS SULFATE, PO Take by mouth.     No current facility-administered medications for this visit.     Review of Systems:  GENERAL:  Feels good.  Active.  No fevers, sweats or weight loss. PERFORMANCE STATUS (ECOG):  *** HEENT:  No visual changes, runny nose, sore throat, mouth sores or tenderness. Lungs: No shortness of breath or cough.  No hemoptysis. Cardiac:  No chest pain, palpitations, orthopnea, or PND. GI:  No nausea, vomiting, diarrhea, constipation, melena or hematochezia. GU:  No urgency, frequency, dysuria, or hematuria. Musculoskeletal:  No back pain.  No joint pain.  No muscle tenderness. Extremities:  No pain or swelling. Skin:  No rashes or skin changes. Neuro:  No headache, numbness or weakness, balance or coordination issues. Endocrine:  No diabetes, thyroid issues, hot flashes or night sweats. Psych:  No mood changes, depression or anxiety. Pain:  No focal pain. Review of systems:  All other systems reviewed and found to be negative.  Physical Exam: There  were no vitals taken for this visit. GENERAL:  Well developed, well nourished, **man sitting comfortably in the exam room in no acute distress. MENTAL STATUS:  Alert and oriented to person, place and time. HEAD:  *** hair.  Normocephalic, atraumatic, face symmetric, no Cushingoid features. EYES:  *** eyes.  Pupils equal round and reactive to light and accomodation.  No conjunctivitis or scleral icterus. ENT:  Oropharynx clear without lesion.  Tongue normal. Mucous membranes moist.   RESPIRATORY:  Clear to auscultation without rales, wheezes or rhonchi. CARDIOVASCULAR:  Regular rate and rhythm without murmur, rub or gallop. ABDOMEN:  Soft, non-tender, with active bowel sounds, and no hepatosplenomegaly.  No masses. SKIN:  No rashes, ulcers or lesions. EXTREMITIES: No edema, no skin discoloration or tenderness.  No palpable cords. LYMPH NODES: No palpable cervical, supraclavicular, axillary or inguinal adenopathy  NEUROLOGICAL: Unremarkable. PSYCH:  Appropriate.   No visits with results within 3 Day(s) from this visit.  Latest known visit with results is:  Orders Only on 06/16/2017  Component Date Value Ref Range Status  . WBC 06/16/2017 8.2  3.8 - 10.8 Thousand/uL Final  . RBC 06/16/2017 4.65  3.80 - 5.10 Million/uL Final  . Hemoglobin 06/16/2017 13.2  11.7 - 15.5 g/dL Final  . HCT 06/16/2017 40.0  35.0 - 45.0 % Final  . MCV 06/16/2017 86.0  80.0 - 100.0 fL Final  . MCH 06/16/2017 28.4  27.0 - 33.0 pg Final  . MCHC 06/16/2017 33.0  32.0 - 36.0 g/dL Final  . RDW 06/16/2017 13.4  11.0 - 15.0 % Final  . Platelets 06/16/2017 483* 140 - 400 Thousand/uL Final  . MPV 06/16/2017 9.1  7.5 - 12.5 fL Final  . Neutro Abs 06/16/2017 5,355  1,500 - 7,800 cells/uL Final  . Lymphs Abs 06/16/2017 2,165  850 - 3,900 cells/uL Final  . WBC mixed population 06/16/2017 467  200 - 950 cells/uL Final  . Eosinophils Absolute 06/16/2017 131  15 - 500 cells/uL Final  . Basophils Absolute 06/16/2017 82  0 - 200 cells/uL Final  . Neutrophils Relative % 06/16/2017 65.3  % Final  . Total Lymphocyte 06/16/2017 26.4  % Final  . Monocytes Relative 06/16/2017 5.7  % Final  . Eosinophils Relative 06/16/2017 1.6  % Final  . Basophils Relative 06/16/2017 1.0  % Final  . RBC 06/16/2017 4.63  3.80 - 5.10 Million/uL Final  . Hemoglobin 06/16/2017 13.4  11.7 - 15.5 g/dL Final  . HCT 06/16/2017 42.1  35.0 - 45.0 % Final  . MCV 06/16/2017 90.9  80.0 - 100.0 fL Final  . MCH 06/16/2017 28.9  27.0 -  33.0 pg Final  . RDW 06/16/2017 13.4  11.0 - 15.0 % Final  . Hgb A 06/16/2017 96.6  >96.0 % Final  . Fetal Hemoglobin Testing 06/16/2017 <1.0  0.0 - 1.9 % Final  . Hemoglobin A2 - HGBRFX 06/16/2017 2.4  1.8 - 3.5 % Final  . Interpretation 06/16/2017    Final   Comment: . Normal phenotype. .   . TEST NAME: 06/16/2017 HEMOGLOBINOPATHY EVALUATION   Final  . TEST CODE: 06/16/2017 [id number]   Final  . CLIENT CONTACT: 06/16/2017 Cassell Smiles   Final  . REPORT ALWAYS MESSAGE SIGNATURE 06/16/2017    Final   Comment: . The laboratory testing on this patient was verbally requested or confirmed by the ordering physician or his or her authorized representative after contact with an employee of [employer]. Federal regulations require that we maintain on file written  authorization for all laboratory testing.  Accordingly we are asking that the ordering physician or his or her authorized representative sign a copy of this report and promptly return it to the client service representative. . . Signature:____________________________________________________ . Please fax this signed page to [phone] or return it via your [employer] courier.     Assessment:  Tasha Simmons is a 47 y.o. female ***  Plan: 1.  Discuss etiologies of thrombocytosis.  Discuss primary and secondary causes. 2.  Labs:  CBC with diff, CMP, ferritin , iron studies, sed rate, CRP, JAK2, CALR, MPL, BCR-ABL. 3.   4.    Lequita Asal, MD  07/30/2017, 3:40 AM

## 2017-09-03 ENCOUNTER — Ambulatory Visit: Payer: BLUE CROSS/BLUE SHIELD | Admitting: Hematology and Oncology

## 2017-09-04 ENCOUNTER — Encounter: Payer: BLUE CROSS/BLUE SHIELD | Admitting: Obstetrics and Gynecology

## 2017-09-05 NOTE — Progress Notes (Signed)
[facility]  Telephone:([phone]) [phone] Fax:(336) [phone]  ID: Tasha Simmons OB: 05-29-1971  MR#: [id number]  EPP#:[id number]  Patient Care Team: Mikey College, NP as PCP - General (Nurse Practitioner)  CHIEF COMPLAINT: Thrombocytosis.  INTERVAL HISTORY: Patient is a 47 year old female who was noted to have a persistently elevated platelet count on routine blood work.  She currently feels well and is asymptomatic.  She has no neurologic complaints.  She denies any recent fevers or illnesses.  She has a good appetite and denies weight loss.  She has no chest pain or shortness of breath.  She denies any nausea, vomiting, constipation, or diarrhea.  She has no melena or hematochezia.  She has no urinary complaints.  Patient feels at her baseline offers no specific complaints today.  REVIEW OF SYSTEMS:   Review of Systems  Constitutional: Negative.  Negative for fever, malaise/fatigue and weight loss.  Respiratory: Negative.  Negative for cough, hemoptysis and shortness of breath.   Cardiovascular: Negative.  Negative for chest pain and leg swelling.  Gastrointestinal: Negative.  Negative for abdominal pain.  Genitourinary: Negative.  Negative for dysuria.  Musculoskeletal: Negative.   Skin: Negative.  Negative for rash.  Neurological: Negative.  Negative for sensory change and weakness.  Psychiatric/Behavioral: Negative.  The patient is not nervous/anxious.     As per HPI. Otherwise, a complete review of systems is negative.  PAST MEDICAL HISTORY: Past Medical History:  Diagnosis Date  . Acid reflux   . Anemia   . Benign breast cyst in female    5 years ago.     PAST SURGICAL HISTORY: Past Surgical History:  Procedure Laterality Date  . BACK SURGERY    . BREAST BIOPSY Left 7 years ago   Benign no scar  . COLONOSCOPY WITH PROPOFOL N/A 06/28/2016   Procedure: COLONOSCOPY WITH PROPOFOL;  Surgeon: Jonathon Bellows, MD;  Location: [facility] ENDOSCOPY;  Service:  Endoscopy;  Laterality: N/A;  . ESOPHAGOGASTRODUODENOSCOPY (EGD) WITH PROPOFOL N/A 06/28/2016   Procedure: ESOPHAGOGASTRODUODENOSCOPY (EGD) WITH PROPOFOL;  Surgeon: Jonathon Bellows, MD;  Location: [facility] ENDOSCOPY;  Service: Endoscopy;  Laterality: N/A;    FAMILY HISTORY: Family History  Problem Relation Age of Onset  . Hyperlipidemia Mother   . Hypertension Mother   . COPD Mother   . Heart disease Father   . Hyperlipidemia Father   . Heart disease Maternal Grandmother   . Thyroid disease Sister   . Breast cancer Neg Hx     ADVANCED DIRECTIVES (Y/N):  N  HEALTH MAINTENANCE: Social History   Tobacco Use  . Smoking status: Former Smoker    Packs/day: 1.00    Years: 13.00    Pack years: 13.00    Last attempt to quit: 12/15/2016    Years since quitting: 0.7  . Smokeless tobacco: Never Used  . Tobacco comment: Still battling cravings  Substance Use Topics  . Alcohol use: No  . Drug use: No     Colonoscopy:  PAP:  Bone density:  Lipid panel:  No Known Allergies  Current Outpatient Medications  Medication Sig Dispense Refill  . IRON, FERROUS SULFATE, PO Take 65 mg by mouth every morning.     Marland Kitchen CARAFATE 1 GM/10ML suspension TAKE 10ML BY MOUTH 4 TIMES DAILY WITH MEALS AND AT BEDTIME 1500 mL 0  . ferrous sulfate 325 (65 FE) MG tablet Take by mouth.     No current facility-administered medications for this visit.     OBJECTIVE: Vitals:   09/12/17 1125  BP: 125/85  Pulse: 98  Resp: 18  Temp: 98.1 F (36.7 C)     Body mass index is 25.2 kg/m.    ECOG FS:0 - Asymptomatic  General: Well-developed, well-nourished, no acute distress. Eyes: Pink conjunctiva, anicteric sclera. HEENT: Normocephalic, moist mucous membranes, clear oropharnyx. Lungs: Clear to auscultation bilaterally. Heart: Regular rate and rhythm. No rubs, murmurs, or gallops. Abdomen: Soft, nontender, nondistended. No organomegaly noted, normoactive bowel sounds. Musculoskeletal: No edema, cyanosis, or  clubbing. Neuro: Alert, answering all questions appropriately. Cranial nerves grossly intact. Skin: No rashes or petechiae noted. Psych: Normal affect. Lymphatics: No cervical, calvicular, axillary or inguinal LAD.   LAB RESULTS:  Lab Results  Component Value Date   NA 137 03/10/2017   K 4.2 03/10/2017   CL 108 03/10/2017   CO2 18 (L) 03/10/2017   GLUCOSE 83 03/10/2017   BUN 12 03/10/2017   CREATININE 0.58 03/10/2017   CALCIUM 8.7 03/10/2017   PROT 6.4 03/10/2017   ALBUMIN 3.8 03/10/2017   AST 12 03/10/2017   ALT 8 03/10/2017   ALKPHOS 45 03/10/2017   BILITOT 0.3 03/10/2017   GFRNONAA >89 03/10/2017   GFRAA >89 03/10/2017    Lab Results  Component Value Date   WBC 10.6 09/12/2017   NEUTROABS 7.3 (H) 09/12/2017   HGB 14.5 09/12/2017   HCT 42.5 09/12/2017   MCV 88.3 09/12/2017   PLT 522 (H) 09/12/2017     STUDIES: No results found.  ASSESSMENT: Thrombocytosis.  PLAN:    1. Thrombocytosis: Patient has a mild, but persistent elevation of her platelet count.  All of her other laboratory work is either negative or within normal limits.  She was noted to have a mildly increased iron saturation ratio and may benefit from hemochromatosis gene testing in the future.  JAK-2 mutation to assess for underlying essential thrombocytosis is pending at time of dictation.  No intervention is needed at this time.  Patient does not require bone marrow biopsy.  Return to clinic in 3 months with repeat laboratory work and further evaluation.  Approximately 45 minutes was spent in discussion of which greater than 50% was consultation.  Patient expressed understanding and was in agreement with this plan. She also understands that She can call clinic at any time with any questions, concerns, or complaints.    Lloyd Huger, MD   09/14/2017 9:06 AM

## 2017-09-12 ENCOUNTER — Inpatient Hospital Stay: Payer: BLUE CROSS/BLUE SHIELD | Attending: Hematology and Oncology | Admitting: Oncology

## 2017-09-12 ENCOUNTER — Other Ambulatory Visit: Payer: Self-pay

## 2017-09-12 ENCOUNTER — Inpatient Hospital Stay: Payer: BLUE CROSS/BLUE SHIELD

## 2017-09-12 ENCOUNTER — Encounter: Payer: Self-pay | Admitting: Oncology

## 2017-09-12 VITALS — BP 125/85 | HR 98 | Temp 98.1°F | Resp 18 | Wt 133.4 lb

## 2017-09-12 DIAGNOSIS — D473 Essential (hemorrhagic) thrombocythemia: Secondary | ICD-10-CM | POA: Diagnosis not present

## 2017-09-12 DIAGNOSIS — D75839 Thrombocytosis, unspecified: Secondary | ICD-10-CM

## 2017-09-12 LAB — IRON AND TIBC
Iron: 173 ug/dL — ABNORMAL HIGH (ref 28–170)
SATURATION RATIOS: 52 % — AB (ref 10.4–31.8)
TIBC: 331 ug/dL (ref 250–450)
UIBC: 158 ug/dL

## 2017-09-12 LAB — CBC WITH DIFFERENTIAL/PLATELET
BASOS ABS: 0.1 10*3/uL (ref 0–0.1)
Basophils Relative: 1 %
EOS ABS: 0.1 10*3/uL (ref 0–0.7)
EOS PCT: 1 %
HCT: 42.5 % (ref 35.0–47.0)
Hemoglobin: 14.5 g/dL (ref 12.0–16.0)
LYMPHS ABS: 2.3 10*3/uL (ref 1.0–3.6)
Lymphocytes Relative: 22 %
MCH: 30.1 pg (ref 26.0–34.0)
MCHC: 34.1 g/dL (ref 32.0–36.0)
MCV: 88.3 fL (ref 80.0–100.0)
MONO ABS: 0.7 10*3/uL (ref 0.2–0.9)
Monocytes Relative: 6 %
Neutro Abs: 7.3 10*3/uL — ABNORMAL HIGH (ref 1.4–6.5)
Neutrophils Relative %: 70 %
PLATELETS: 522 10*3/uL — AB (ref 150–440)
RBC: 4.81 MIL/uL (ref 3.80–5.20)
RDW: 13.8 % (ref 11.5–14.5)
WBC: 10.6 10*3/uL (ref 3.6–11.0)

## 2017-09-12 LAB — LACTATE DEHYDROGENASE: LDH: 104 U/L (ref 98–192)

## 2017-09-12 LAB — FERRITIN: FERRITIN: 29 ng/mL (ref 11–307)

## 2017-09-12 LAB — VITAMIN B12: Vitamin B-12: 339 pg/mL (ref 180–914)

## 2017-09-12 LAB — FOLATE: Folate: 19.4 ng/mL (ref >5.9)

## 2017-09-12 NOTE — Progress Notes (Signed)
Here for new pt evaluation.  

## 2017-09-22 ENCOUNTER — Telehealth: Payer: BLUE CROSS/BLUE SHIELD | Admitting: Family

## 2017-09-22 DIAGNOSIS — J069 Acute upper respiratory infection, unspecified: Secondary | ICD-10-CM

## 2017-09-22 MED ORDER — FLUTICASONE PROPIONATE 50 MCG/ACT NA SUSP: 2.0000 | Freq: Every day | NASAL | 6 refills | Status: DC

## 2017-09-22 MED ORDER — BENZONATATE 100 MG PO CAPS: 100.0000 mg | ORAL_CAPSULE | Freq: Three times a day (TID) | ORAL | 0 refills | Status: DC | PRN

## 2017-09-22 NOTE — Progress Notes (Signed)

## 2017-09-23 LAB — JAK2  V617F QUAL. WITH REFLEX TO EXON 12

## 2017-09-23 LAB — JAK2 EXONS 12-15

## 2017-09-30 ENCOUNTER — Encounter: Payer: Self-pay | Admitting: Obstetrics and Gynecology

## 2017-09-30 ENCOUNTER — Ambulatory Visit (INDEPENDENT_AMBULATORY_CARE_PROVIDER_SITE_OTHER): Payer: BLUE CROSS/BLUE SHIELD | Admitting: Obstetrics and Gynecology

## 2017-09-30 VITALS — BP 126/84 | HR 85 | Ht 61.0 in | Wt 134.2 lb

## 2017-09-30 DIAGNOSIS — D251 Intramural leiomyoma of uterus: Secondary | ICD-10-CM

## 2017-09-30 DIAGNOSIS — Z01818 Encounter for other preprocedural examination: Secondary | ICD-10-CM | POA: Diagnosis not present

## 2017-09-30 DIAGNOSIS — D500 Iron deficiency anemia secondary to blood loss (chronic): Secondary | ICD-10-CM | POA: Diagnosis not present

## 2017-09-30 DIAGNOSIS — N920 Excessive and frequent menstruation with regular cycle: Secondary | ICD-10-CM

## 2017-09-30 MED ORDER — METRONIDAZOLE 500 MG PO TABS: 500.0000 mg | ORAL_TABLET | Freq: Two times a day (BID) | ORAL | 0 refills | Status: DC

## 2017-09-30 NOTE — H&P (View-Only) (Signed)
PRE-OPERATIVE HISTORY AND PHYSICAL EXAM  PCP:  Mikey College, NP Subjective:   HPI:  Tasha Simmons is a 47 y.o. [id number].  Patient's last menstrual period was 09/24/2017.  She presents today for a pre-op discussion and PE.  She has the following symptoms: Very heavy menstrual bleeding often dropping her blood count.  She regularly has significant pelvic discomfort.  She also occasionally feels tired from the blood loss anemia.  She takes iron daily. She has decided upon definitive management-surgery for her heavy bleeding and uterine fibroids.  Her sister had a very similar problem and underwent hysterectomy. [name] recently had an H&H which was found to be normal.  Review of Systems:   Constitutional: Denied constitutional symptoms, night sweats, recent illness, fatigue, fever, insomnia and weight loss.  Eyes: Denied eye symptoms, eye pain, photophobia, vision change and visual disturbance.  Ears/Nose/Throat/Neck: Denied ear, nose, throat or neck symptoms, hearing loss, nasal discharge, sinus congestion and sore throat.  Cardiovascular: Denied cardiovascular symptoms, arrhythmia, chest pain/pressure, edema, exercise intolerance, orthopnea and palpitations.  Respiratory: Denied pulmonary symptoms, asthma, pleuritic pain, productive sputum, cough, dyspnea and wheezing.  Gastrointestinal: Denied, gastro-esophageal reflux, melena, nausea and vomiting.  Genitourinary: See HPI for additional information.  Musculoskeletal: Denied musculoskeletal symptoms, stiffness, swelling, muscle weakness and myalgia.  Dermatologic: Denied dermatology symptoms, rash and scar.  Neurologic: Denied neurology symptoms, dizziness, headache, neck pain and syncope.  Psychiatric: Denied psychiatric symptoms, anxiety and depression.  Endocrine: Denied endocrine symptoms including hot flashes and night sweats.   OB History  Gravida Para Term Preterm AB Living  2 2 2     2   SAB TAB Ectopic Multiple  Live Births          2    # Outcome Date GA Lbr Len/2nd Weight Sex Delivery Anes PTL Lv  2 Term 1994   6 lb (2.722 kg) M Vag-Spont  N LIV  1 Term 1991   7 lb 1 oz (3.204 kg) F Vag-Spont  N LIV      Past Medical History:  Diagnosis Date  . Acid reflux   . Anemia   . Benign breast cyst in female    5 years ago.     Past Surgical History:  Procedure Laterality Date  . BACK SURGERY    . BREAST BIOPSY Left 7 years ago   Benign no scar  . COLONOSCOPY WITH PROPOFOL N/A 06/28/2016   Procedure: COLONOSCOPY WITH PROPOFOL;  Surgeon: Jonathon Bellows, MD;  Location: [facility] ENDOSCOPY;  Service: Endoscopy;  Laterality: N/A;  . ESOPHAGOGASTRODUODENOSCOPY (EGD) WITH PROPOFOL N/A 06/28/2016   Procedure: ESOPHAGOGASTRODUODENOSCOPY (EGD) WITH PROPOFOL;  Surgeon: Jonathon Bellows, MD;  Location: [facility] END[facility]COPY;  Service: Endoscopy;  Laterality: N/A;      SOCIAL HISTORY: Social History   Tobacco Use  Smoking Status Former Smoker  . Packs/day: 1.00  . Years: 13.00  . Pack years: 13.00  . Last attempt to quit: 12/15/2016  . Years since quitting: 0.7  Smokeless Tobacco Never Used  Tobacco Comment   Still battling cravings   Social History   Substance and Sexual Activity  Alcohol Use No   Social History   Substance and Sexual Activity  Drug Use No    Family History  Problem Relation Age of Onset  . Hyperlipidemia Mother   . Hypertension Mother   . COPD Mother   . Heart disease Father   . Hyperlipidemia Father   . Heart disease Maternal Grandmother   .  Thyroid disease Sister   . Breast cancer Neg Hx     ALLERGIES:  Patient has no known allergies.  MEDS:   Current Outpatient Medications on File Prior to Visit  Medication Sig Dispense Refill  . cyanocobalamin 1000 MCG tablet Take 1,000 mcg by mouth daily.    . IRON, FERROUS SULFATE, PO Take 65 mg by mouth every morning.     . benzonatate (TESSALON PERLES) 100 MG capsule Take 1 capsule (100 mg total) by mouth 3 (three) times daily as  needed. (Patient not taking: Reported on 09/30/2017) 20 capsule 0  . CARAFATE 1 GM/10ML suspension TAKE 10ML BY MOUTH 4 TIMES DAILY WITH MEALS AND AT BEDTIME 1500 mL 0  . ferrous sulfate 325 (65 FE) MG tablet Take by mouth.    . fluticasone (FLONASE) 50 MCG/ACT nasal spray Place 2 sprays into both nostrils daily. (Patient not taking: Reported on 09/30/2017) 16 g 6   No current facility-administered medications on file prior to visit.     No orders of the defined types were placed in this encounter.    Physical examination BP 126/84   Pulse 85   Ht 5\' 1"  (1.549 m)   Wt 134 lb 3.2 oz (60.9 kg)   LMP 09/24/2017   BMI 25.36 kg/m   General NAD, Conversant  HEENT Atraumatic; Op clear with mmm.  Normo-cephalic. Pupils reactive. Anicteric sclerae  Thyroid/Neck Smooth without nodularity or enlargement. Normal ROM.  Neck Supple.  Skin No rashes, lesions or ulceration. Normal palpated skin turgor. No nodularity.  Breasts: No masses or discharge.  Symmetric.  No axillary adenopathy.  Lungs: Clear to auscultation.No rales or wheezes. Normal Respiratory effort, no retractions.  Heart: NSR.  No murmurs or rubs appreciated. No periferal edema  Abdomen: Soft.  Non-tender.  No masses.  No HSM. No hernia  Extremities: Moves all appropriately.  Normal ROM for age. No lymphadenopathy.  Neuro: Oriented to PPT.  Normal mood. Normal affect.     Pelvic:   Vulva: Normal appearance.  No lesions.  Vagina: No lesions or abnormalities noted.  Support: Normal pelvic support.  Urethra No masses tenderness or scarring.  Meatus Normal size without lesions or prolapse.  Cervix: Normal ectropion.  No lesions.  Anus: Normal exam.  No lesions.  Perineum: Normal exam.  No lesions.        Bimanual   Uterus:  Enlarged approximately 14-16 weeks.  Non-tender.  Mobile.  AV.  Adnexae: No masses.  Non-tender to palpation.  Cul-de-sac: Negative for abnormality.   Assessment:   [id number] Patient Active Problem List    Diagnosis Date Noted  . Thrombocytosis ([place]) 06/17/2017  . Uterine fibroid 04/04/2017  . 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 Heart murmur, systolic 62/56/3893  . Hyperlipidemia 09/09/2016  . Duodenitis   . Polyp of duodenum   . 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 Iron deficiency anemia 02/12/2016  . Benign cyst of left breast 07/07/2015  . Gastroesophageal reflux disease without esophagitis 07/07/2015    1. Chronic blood loss anemia   2. Fibroids, intramural   3. Menorrhagia with regular cycle   4.      Patient desires hysterectomy.   Plan:   Orders: No orders of the defined types were placed in this encounter.    1.  laparoscopic assisted vaginal hysterectomy

## 2017-09-30 NOTE — Progress Notes (Signed)
PRE-OPERATIVE HISTORY AND PHYSICAL EXAM  PCP:  Tasha College, NP Subjective:   HPI:  Tasha Simmons is a 47 y.o. [id number].  Patient's last menstrual period was 09/24/2017.  She presents today for a pre-op discussion and PE.  She has the following symptoms: Very heavy menstrual bleeding often dropping her blood count.  She regularly has significant pelvic discomfort.  She also occasionally feels tired from the blood loss anemia.  She takes iron daily. She has decided upon definitive management-surgery for her heavy bleeding and uterine fibroids.  Her sister had a very similar problem and underwent hysterectomy. Tasha Simmons recently had an H&H which was found to be normal.  Review of Systems:   Constitutional: Denied constitutional symptoms, night sweats, recent illness, fatigue, fever, insomnia and weight loss.  Eyes: Denied eye symptoms, eye pain, photophobia, vision change and visual disturbance.  Ears/Nose/Throat/Neck: Denied ear, nose, throat or neck symptoms, hearing loss, nasal discharge, sinus congestion and sore throat.  Cardiovascular: Denied cardiovascular symptoms, arrhythmia, chest pain/pressure, edema, exercise intolerance, orthopnea and palpitations.  Respiratory: Denied pulmonary symptoms, asthma, pleuritic pain, productive sputum, cough, dyspnea and wheezing.  Gastrointestinal: Denied, gastro-esophageal reflux, melena, nausea and vomiting.  Genitourinary: See HPI for additional information.  Musculoskeletal: Denied musculoskeletal symptoms, stiffness, swelling, muscle weakness and myalgia.  Dermatologic: Denied dermatology symptoms, rash and scar.  Neurologic: Denied neurology symptoms, dizziness, headache, neck pain and syncope.  Psychiatric: Denied psychiatric symptoms, anxiety and depression.  Endocrine: Denied endocrine symptoms including hot flashes and night sweats.   OB History  Gravida Para Term Preterm AB Living  2 2 2     2   SAB TAB Ectopic Multiple  Live Births          2    # Outcome Date GA Lbr Len/2nd Weight Sex Delivery Anes PTL Lv  2 Term 1994   6 lb (2.722 kg) M Vag-Spont  N LIV  1 Term 1991   7 lb 1 oz (3.204 kg) F Vag-Spont  N LIV      Past Medical History:  Diagnosis Date  . Acid reflux   . Anemia   . Benign breast cyst in female    5 years ago.     Past Surgical History:  Procedure Laterality Date  . BACK SURGERY    . BREAST BIOPSY Left 7 years ago   Benign no scar  . COLONOSCOPY WITH PROPOFOL N/A 06/28/2016   Procedure: COLONOSCOPY WITH PROPOFOL;  Surgeon: Jonathon Bellows, MD;  Location: [facility];  Service: Endoscopy;  Laterality: N/A;  . ESOPHAGOGASTRODUODENOSCOPY (EGD) WITH PROPOFOL N/A 06/28/2016   Procedure: ESOPHAGOGASTRODUODENOSCOPY (EGD) WITH PROPOFOL;  Surgeon: Jonathon Bellows, MD;  Location: [facility] ENDOSCOPY;  Service: Endoscopy;  Laterality: N/A;      SOCIAL HISTORY:  Social History   Tobacco Use  Smoking Status Former Smoker  . Packs/day: 1.00  . Years: 13.00  . Pack years: 13.00  . Last attempt to quit: 12/15/2016  . Years since quitting: 0.7  Smokeless Tobacco Never Used  Tobacco Comment   Still battling cravings   Social History   Substance and Sexual Activity  Alcohol Use No    Social History   Substance and Sexual Activity  Drug Use No    Family History  Problem Relation Age of Onset  . Hyperlipidemia Mother   . Hypertension Mother   . COPD Mother   . Heart disease Father   . Hyperlipidemia Father   . Heart disease Maternal  Grandmother   . Thyroid disease Sister   . Breast cancer Neg Hx     ALLERGIES:  Patient has no known allergies.  MEDS:   Current Outpatient Medications on File Prior to Visit  Medication Sig Dispense Refill  . cyanocobalamin 1000 MCG tablet Take 1,000 mcg by mouth daily.    . IRON, FERROUS SULFATE, PO Take 65 mg by mouth every morning.     . benzonatate (TESSALON PERLES) 100 MG capsule Take 1 capsule (100 mg total) by mouth 3 (three) times daily as  needed. (Patient not taking: Reported on 09/30/2017) 20 capsule 0  . CARAFATE 1 GM/10ML suspension TAKE 10ML BY MOUTH 4 TIMES DAILY WITH MEALS AND AT BEDTIME 1500 mL 0  . ferrous sulfate 325 (65 FE) MG tablet Take by mouth.    . fluticasone (FLONASE) 50 MCG/ACT nasal spray Place 2 sprays into both nostrils daily. (Patient not taking: Reported on 09/30/2017) 16 g 6   No current facility-administered medications on file prior to visit.     Meds ordered this encounter  Medications  . metroNIDAZOLE (FLAGYL) 500 MG tablet    Sig: Take 1 tablet (500 mg total) by mouth 2 (two) times daily. Begin 5 days prior to scheduled surgery as directed.    Dispense:  10 tablet    Refill:  0     Physical examination BP 126/84   Pulse 85   Ht 5\' 1"  (1.549 m)   Wt 134 lb 3.2 oz (60.9 kg)   LMP 09/24/2017   BMI 25.36 kg/m   General NAD, Conversant  HEENT Atraumatic; Op clear with mmm.  Normo-cephalic. Pupils reactive. Anicteric sclerae  Thyroid/Neck Smooth without nodularity or enlargement. Normal ROM.  Neck Supple.  Skin No rashes, lesions or ulceration. Normal palpated skin turgor. No nodularity.  Breasts: No masses or discharge.  Symmetric.  No axillary adenopathy.  Lungs: Clear to auscultation.No rales or wheezes. Normal Respiratory effort, no retractions.  Heart: NSR.  No murmurs or rubs appreciated. No periferal edema  Abdomen: Soft.  Non-tender.  No masses.  No HSM. No hernia  Extremities: Moves all appropriately.  Normal ROM for age. No lymphadenopathy.  Neuro: Oriented to PPT.  Normal mood. Normal affect.     Pelvic:   Vulva: Normal appearance.  No lesions.  Vagina: No lesions or abnormalities noted.  Support: Normal pelvic support.  Urethra No masses tenderness or scarring.  Meatus Normal size without lesions or prolapse.  Cervix: Normal ectropion.  No lesions.  Anus: Normal exam.  No lesions.  Perineum: Normal exam.  No lesions.        Bimanual   Uterus:  Enlarged approximately 14-16  weeks.  Non-tender.  Mobile.  AV.  Adnexae: No masses.  Non-tender to palpation.  Cul-de-sac: Negative for abnormality.   Assessment:   [id number] Patient Active Problem List   Diagnosis Date Noted  . Thrombocytosis ([place]) 06/17/2017  . Uterine fibroid 04/04/2017  . 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 Heart murmur, systolic 40/81/4481  . Hyperlipidemia 09/09/2016  . Duodenitis   . Polyp of duodenum   . 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 Iron deficiency anemia 02/12/2016  . Benign cyst of left breast 07/07/2015  . Gastroesophageal reflux disease without esophagitis 07/07/2015    1. Preop examination   2. Chronic blood loss anemia   3. Fibroids, intramural   4. Menorrhagia with regular cycle   4.      Patient desires hysterectomy.  Plan:   Orders: Meds ordered this encounter  Medications  . metroNIDAZOLE (FLAGYL) 500 MG tablet    Sig: Take 1 tablet (500 mg total) by mouth 2 (two) times daily. Begin 5 days prior to scheduled surgery as directed.    Dispense:  10 tablet    Refill:  0     1.  laparoscopic assisted vaginal hysterectomy  Pre-op discussions regarding Risks and Benefits of her scheduled surgery.  LAVH The procedure of Laparoscopic Assisted Vaginal Hysterectomy was described to the patient in detail.  We reviewed the rationale for Hysterectomy and the patient was again informed of other nonsurgical management possibilities for her condition.  She has considered these other options, and desires a Hysterectomy.  We have reviewed the fact that Hysterectomy is permanent and that following the procedure she will not be able to become pregnant or bear children.  We have discussed the following risk factors specifically and the patient has also been informed that additional complications not mentioned may develop:  Damage to bowel, bladder, ureters or to other internal organs, bleeding, infection and the risk from anesthesia.  We  have discussed the procedure itself in detail and she has an informed understanding of this surgery.  We have also discussed the recovery period in which physical and sexual activity will be restricted for a varying degree of time, often 3 - 6 weeks. The Laparoscopic Portion of Hysterectomy has also been reviewed with the patient.  She understands how the laparoscope facilitates the procedure.  We have discussed the abdominal incisions and punctures that will be used.  We have also reviewed the increased Operating Room time often accompanying LAVH.  The slightly increased risk of complications secondary to abdominal punctures, and use of laparoscopic instrumentation has also been discussed in detail.I have answered all of her questions and I believe the patient has an informed understanding of the procedure of Laparoscopic Assisted Vaginal Hysterectomy.  Oophorectomy The option of Oophorectomy has been discussed with the patient.  Detailed risk/benefits have been reviewed.  The risks discussed include, but are not limited to, hemorrhage, infection, damage to ureter or other internal organ, and Ovarian Remnant Syndrome.  The benefits include a significant decrease in the risk of Ovarian Cancer and in benign Ovarian disease.  The risk of Ovarian CA has been estimated at 1 in 22.  This is a relatively small risk.  However, should Ovarian CA develop, it is often found late in the course of the disease.  We have also discussed the role of inheritance in the development of Ovarian disease.  Some women, who have close relatives with Ovarian CA, have a higher than 1 in 70 risk of Ovarian CA.  The benefits of Estrogen replacement therapy following Oophorectomy has been stressed.  If she is premenopausal, we have discussed the fact that this procedure will make her permanently sterile and that premature menopause will result if no ERT is begun.  I have answered all of her questions, and I believe that she has an adequate  and informed understanding of the risks and benefits of Oophorectomy. Patient has decided that she would like to keep her ovaries.  We have discussed this in detail and she is sure. Should she change her mind I have asked her to contact me prior to surgery.  Finis Bud, M.D. 09/30/2017 11:51 AM

## 2017-09-30 NOTE — H&P (Signed)
PRE-OPERATIVE HISTORY AND PHYSICAL EXAM  PCP:  Tasha College, NP Subjective:   HPI:  Tasha Simmons is a 47 y.o. [id number].  Patient's last menstrual period was 09/24/2017.  She presents today for a pre-op discussion and PE.  She has the following symptoms: Very heavy menstrual bleeding often dropping her blood count.  She regularly has significant pelvic discomfort.  She also occasionally feels tired from the blood loss anemia.  She takes iron daily. She has decided upon definitive management-surgery for her heavy bleeding and uterine fibroids.  Her sister had a very similar problem and underwent hysterectomy. Tasha Simmons recently had an H&H which was found to be normal.  Review of Systems:   Constitutional: Denied constitutional symptoms, night sweats, recent illness, fatigue, fever, insomnia and weight loss.  Eyes: Denied eye symptoms, eye pain, photophobia, vision change and visual disturbance.  Ears/Nose/Throat/Neck: Denied ear, nose, throat or neck symptoms, hearing loss, nasal discharge, sinus congestion and sore throat.  Cardiovascular: Denied cardiovascular symptoms, arrhythmia, chest pain/pressure, edema, exercise intolerance, orthopnea and palpitations.  Respiratory: Denied pulmonary symptoms, asthma, pleuritic pain, productive sputum, cough, dyspnea and wheezing.  Gastrointestinal: Denied, gastro-esophageal reflux, melena, nausea and vomiting.  Genitourinary: See HPI for additional information.  Musculoskeletal: Denied musculoskeletal symptoms, stiffness, swelling, muscle weakness and myalgia.  Dermatologic: Denied dermatology symptoms, rash and scar.  Neurologic: Denied neurology symptoms, dizziness, headache, neck pain and syncope.  Psychiatric: Denied psychiatric symptoms, anxiety and depression.  Endocrine: Denied endocrine symptoms including hot flashes and night sweats.   OB History  Gravida Para Term Preterm AB Living  2 2 2     2   SAB TAB Ectopic Multiple  Live Births          2    # Outcome Date GA Lbr Len/2nd Weight Sex Delivery Anes PTL Lv  2 Term 1994   6 lb (2.722 kg) M Vag-Spont  N LIV  1 Term 1991   7 lb 1 oz (3.204 kg) F Vag-Spont  N LIV      Past Medical History:  Diagnosis Date  . Acid reflux   . Anemia   . Benign breast cyst in female    5 years ago.     Past Surgical History:  Procedure Laterality Date  . BACK SURGERY    . BREAST BIOPSY Left 7 years ago   Benign no scar  . COLONOSCOPY WITH PROPOFOL N/A 06/28/2016   Procedure: COLONOSCOPY WITH PROPOFOL;  Surgeon: Tasha Bellows, MD;  Location: [facility];  Service: Endoscopy;  Laterality: N/A;  . ESOPHAGOGASTRODUODENOSCOPY (EGD) WITH PROPOFOL N/A 06/28/2016   Procedure: ESOPHAGOGASTRODUODENOSCOPY (EGD) WITH PROPOFOL;  Surgeon: Tasha Bellows, MD;  Location: [facility] END[facility];  Service: Endoscopy;  Laterality: N/A;      SOCIAL HISTORY: Social History   Tobacco Use  Smoking Status Former Smoker  . Packs/day: 1.00  . Years: 13.00  . Pack years: 13.00  . Last attempt to quit: 12/15/2016  . Years since quitting: 0.7  Smokeless Tobacco Never Used  Tobacco Comment   Still battling cravings   Social History   Substance and Sexual Activity  Alcohol Use No   Social History   Substance and Sexual Activity  Drug Use No    Family History  Problem Relation Age of Onset  . Hyperlipidemia Mother   . Hypertension Mother   . COPD Mother   . Heart disease Father   . Hyperlipidemia Father   . Heart disease Maternal Grandmother   .  Thyroid disease Sister   . Breast cancer Neg Hx     ALLERGIES:  Patient has no known allergies.  MEDS:   Current Outpatient Medications on File Prior to Visit  Medication Sig Dispense Refill  . cyanocobalamin 1000 MCG tablet Take 1,000 mcg by mouth daily.    . IRON, FERROUS SULFATE, PO Take 65 mg by mouth every morning.     . benzonatate (TESSALON PERLES) 100 MG capsule Take 1 capsule (100 mg total) by mouth 3 (three) times daily as  needed. (Patient not taking: Reported on 09/30/2017) 20 capsule 0  . CARAFATE 1 GM/10ML suspension TAKE 10ML BY MOUTH 4 TIMES DAILY WITH MEALS AND AT BEDTIME 1500 mL 0  . ferrous sulfate 325 (65 FE) MG tablet Take by mouth.    . fluticasone (FLONASE) 50 MCG/ACT nasal spray Place 2 sprays into both nostrils daily. (Patient not taking: Reported on 09/30/2017) 16 g 6   No current facility-administered medications on file prior to visit.     No orders of the defined types were placed in this encounter.    Physical examination BP 126/84   Pulse 85   Ht 5\' 1"  (1.549 m)   Wt 134 lb 3.2 oz (60.9 kg)   LMP 09/24/2017   BMI 25.36 kg/m   General NAD, Conversant  HEENT Atraumatic; Op clear with mmm.  Normo-cephalic. Pupils reactive. Anicteric sclerae  Thyroid/Neck Smooth without nodularity or enlargement. Normal ROM.  Neck Supple.  Skin No rashes, lesions or ulceration. Normal palpated skin turgor. No nodularity.  Breasts: No masses or discharge.  Symmetric.  No axillary adenopathy.  Lungs: Clear to auscultation.No rales or wheezes. Normal Respiratory effort, no retractions.  Heart: NSR.  No murmurs or rubs appreciated. No periferal edema  Abdomen: Soft.  Non-tender.  No masses.  No HSM. No hernia  Extremities: Moves all appropriately.  Normal ROM for age. No lymphadenopathy.  Neuro: Oriented to PPT.  Normal mood. Normal affect.     Pelvic:   Vulva: Normal appearance.  No lesions.  Vagina: No lesions or abnormalities noted.  Support: Normal pelvic support.  Urethra No masses tenderness or scarring.  Meatus Normal size without lesions or prolapse.  Cervix: Normal ectropion.  No lesions.  Anus: Normal exam.  No lesions.  Perineum: Normal exam.  No lesions.        Bimanual   Uterus:  Enlarged approximately 14-16 weeks.  Non-tender.  Mobile.  AV.  Adnexae: No masses.  Non-tender to palpation.  Cul-de-sac: Negative for abnormality.   Assessment:   [id number] Patient Active Problem List    Diagnosis Date Noted  . Thrombocytosis ([place]) 06/17/2017  . Uterine fibroid 04/04/2017  . 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 Heart murmur, systolic 00/86/7619  . Hyperlipidemia 09/09/2016  . Duodenitis   . Polyp of duodenum   . 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 Iron deficiency anemia 02/12/2016  . Benign cyst of left breast 07/07/2015  . Gastroesophageal reflux disease without esophagitis 07/07/2015    1. Chronic blood loss anemia   2. Fibroids, intramural   3. Menorrhagia with regular cycle   4.      Patient desires hysterectomy.   Plan:   Orders: No orders of the defined types were placed in this encounter.    1.  laparoscopic assisted vaginal hysterectomy

## 2017-10-23 ENCOUNTER — Other Ambulatory Visit: Payer: Self-pay

## 2017-10-23 ENCOUNTER — Encounter
Admission: RE | Admit: 2017-10-23 | Discharge: 2017-10-23 | Disposition: A | Discharge status: Home / Self Care | Payer: BLUE CROSS/BLUE SHIELD | Source: Ambulatory Visit | Attending: Obstetrics and Gynecology | Admitting: Obstetrics and Gynecology

## 2017-10-23 DIAGNOSIS — Z79899 Other long term (current) drug therapy: Secondary | ICD-10-CM | POA: Insufficient documentation

## 2017-10-23 DIAGNOSIS — Z87891 Personal history of nicotine dependence: Secondary | ICD-10-CM | POA: Diagnosis not present

## 2017-10-23 DIAGNOSIS — D500 Iron deficiency anemia secondary to blood loss (chronic): Secondary | ICD-10-CM | POA: Insufficient documentation

## 2017-10-23 DIAGNOSIS — N920 Excessive and frequent menstruation with regular cycle: Secondary | ICD-10-CM | POA: Insufficient documentation

## 2017-10-23 DIAGNOSIS — Z01812 Encounter for preprocedural laboratory examination: Secondary | ICD-10-CM | POA: Diagnosis not present

## 2017-10-23 DIAGNOSIS — D251 Intramural leiomyoma of uterus: Secondary | ICD-10-CM | POA: Insufficient documentation

## 2017-10-23 HISTORY — DX: Essential (hemorrhagic) thrombocythemia: D47.3

## 2017-10-23 HISTORY — DX: Unspecified hearing loss, left ear: H91.92

## 2017-10-23 HISTORY — DX: Leiomyoma of uterus, unspecified: D25.9

## 2017-10-23 HISTORY — DX: Hyperlipidemia, unspecified: E78.5

## 2017-10-23 HISTORY — DX: Cardiac murmur, unspecified: R01.1

## 2017-10-23 HISTORY — DX: Duodenitis without bleeding: K29.80

## 2017-10-23 HISTORY — DX: Thrombocytosis, unspecified: D75.839

## 2017-10-23 LAB — CBC
HEMATOCRIT: 43.1 % (ref 35.0–47.0)
Hemoglobin: 14.1 g/dL (ref 12.0–16.0)
MCH: 29.6 pg (ref 26.0–34.0)
MCHC: 32.7 g/dL (ref 32.0–36.0)
MCV: 90.6 fL (ref 80.0–100.0)
Platelets: 461 10*3/uL — ABNORMAL HIGH (ref 150–440)
RBC: 4.76 MIL/uL (ref 3.80–5.20)
RDW: 14.1 % (ref 11.5–14.5)
WBC: 10 10*3/uL (ref 3.6–11.0)

## 2017-10-23 NOTE — Patient Instructions (Signed)
Your procedure is scheduled on: Mon. 10/27/17 Report to Day Surgery. To find out your arrival time please call [phone] between 1PM - 3PM on tomorrow  Remember: Instructions that are not followed completely may result in serious medical risk, up to and including death, or upon the discretion of your surgeon and anesthesiologist your surgery may need to be rescheduled.     _X__ 1. Do not eat food after midnight the night before your procedure.                 No gum chewing or hard candies. You may drink clear liquids up to 2 hours                 before you are scheduled to arrive for your surgery- DO not drink clear                 liquids within 2 hours of the start of your surgery.                 Clear Liquids include:  water, apple juice without pulp, clear carbohydrate                 drink such as Clearfast of Gartorade, Black Coffee or Tea (Do not add                 anything to coffee or tea).  __X__2.  On the morning of surgery brush your teeth with toothpaste and water, you  may rinse your mouth with mouthwash if you wish.  Do not swallow any              toothpaste of mouthwash.     ___ 3.  No Alcohol for 24 hours before or after surgery.   __ 4.  Do Not Smoke or use e-cigarettes For 24 Hours Prior to Your Surgery.                 Do not use any chewable tobacco products for at least 6 hours prior to                 surgery.  ____  5.  Bring all medications with you on the day of surgery if instructed.   __x__  6.  Notify your doctor if there is any change in your medical condition      (cold, fever, infections).     Do not wear jewelry, make-up, hairpins, clips or nail polish. Do not wear lotions, powders, or perfumes. You may wear deodorant. Do not shave 48 hours prior to surgery. Men may shave face and neck. Do not bring valuables to the hospital.    [facility] is not responsible for any belongings or valuables.  Contacts, dentures or  bridgework may not be worn into surgery. Leave your suitcase in the car. After surgery it may be brought to your room. For patients admitted to the hospital, discharge time is determined by your treatment team.   Patients discharged the day of surgery will not be allowed to drive home.   Please read over the following fact sheets that you were given:    __x__ Take these medicines the morning of surgery with A SIP OF WATER:    1. none  2.   3.   4.  5.  6.  ____ Fleet Enema (as directed)   __x__ Use CHG Soap as directed  ____ Use inhalers on the day of surgery  ____ Stop  metformin 2 days prior to surgery    ____ Take 1/2 of usual insulin dose the night before surgery. No insulin the morning          of surgery.   ____ Stop Coumadin/Plavix/aspirin on   _x___ Stop Anti-inflammatories  ibuprofen or Aleve, Aspirin today   _x___ Stop supplements until after surgery.  Melatonin 5 MG TBDP  ____ Bring C-Pap to the hospital.

## 2017-10-26 MED ORDER — CEFAZOLIN SODIUM-DEXTROSE 2-4 GM/100ML-% IV SOLN
2.0000 g | INTRAVENOUS | Status: AC
  Administered 2017-10-27: 2 g via INTRAVENOUS

## 2017-10-27 ENCOUNTER — Other Ambulatory Visit: Payer: Self-pay

## 2017-10-27 ENCOUNTER — Ambulatory Visit: Payer: BLUE CROSS/BLUE SHIELD | Admitting: Certified Registered"

## 2017-10-27 ENCOUNTER — Encounter
Admission: AD | Disposition: A | Discharge status: Home / Self Care | Payer: Self-pay | Source: Ambulatory Visit | Attending: Obstetrics and Gynecology

## 2017-10-27 ENCOUNTER — Encounter: Payer: Self-pay | Admitting: *Deleted

## 2017-10-27 ENCOUNTER — Observation Stay
Admission: AD | Admit: 2017-10-27 | Discharge: 2017-10-28 | Disposition: A | Discharge status: Home / Self Care | Payer: BLUE CROSS/BLUE SHIELD | Source: Ambulatory Visit | Attending: Obstetrics and Gynecology | Admitting: Obstetrics and Gynecology

## 2017-10-27 DIAGNOSIS — Z9889 Other specified postprocedural states: Secondary | ICD-10-CM

## 2017-10-27 DIAGNOSIS — E785 Hyperlipidemia, unspecified: Secondary | ICD-10-CM | POA: Insufficient documentation

## 2017-10-27 DIAGNOSIS — Z87891 Personal history of nicotine dependence: Secondary | ICD-10-CM | POA: Diagnosis not present

## 2017-10-27 DIAGNOSIS — Z79899 Other long term (current) drug therapy: Secondary | ICD-10-CM | POA: Insufficient documentation

## 2017-10-27 DIAGNOSIS — D259 Leiomyoma of uterus, unspecified: Secondary | ICD-10-CM | POA: Insufficient documentation

## 2017-10-27 DIAGNOSIS — N920 Excessive and frequent menstruation with regular cycle: Secondary | ICD-10-CM | POA: Diagnosis not present

## 2017-10-27 DIAGNOSIS — K2980 Duodenitis without bleeding: Secondary | ICD-10-CM | POA: Diagnosis not present

## 2017-10-27 DIAGNOSIS — N838 Other noninflammatory disorders of ovary, fallopian tube and broad ligament: Secondary | ICD-10-CM | POA: Insufficient documentation

## 2017-10-27 DIAGNOSIS — D500 Iron deficiency anemia secondary to blood loss (chronic): Secondary | ICD-10-CM | POA: Insufficient documentation

## 2017-10-27 DIAGNOSIS — K219 Gastro-esophageal reflux disease without esophagitis: Secondary | ICD-10-CM | POA: Insufficient documentation

## 2017-10-27 DIAGNOSIS — N939 Abnormal uterine and vaginal bleeding, unspecified: Secondary | ICD-10-CM | POA: Diagnosis not present

## 2017-10-27 DIAGNOSIS — H9192 Unspecified hearing loss, left ear: Secondary | ICD-10-CM | POA: Diagnosis not present

## 2017-10-27 DIAGNOSIS — Z8249 Family history of ischemic heart disease and other diseases of the circulatory system: Secondary | ICD-10-CM | POA: Insufficient documentation

## 2017-10-27 DIAGNOSIS — Z825 Family history of asthma and other chronic lower respiratory diseases: Secondary | ICD-10-CM | POA: Insufficient documentation

## 2017-10-27 DIAGNOSIS — R011 Cardiac murmur, unspecified: Secondary | ICD-10-CM | POA: Diagnosis not present

## 2017-10-27 DIAGNOSIS — N72 Inflammatory disease of cervix uteri: Secondary | ICD-10-CM | POA: Diagnosis not present

## 2017-10-27 DIAGNOSIS — Z8349 Family history of other endocrine, nutritional and metabolic diseases: Secondary | ICD-10-CM | POA: Diagnosis not present

## 2017-10-27 HISTORY — DX: Other specified postprocedural states: Z98.890

## 2017-10-27 HISTORY — PX: LAPAROSCOPIC ASSISTED VAGINAL HYSTERECTOMY: SHX5398

## 2017-10-27 HISTORY — DX: Failed or difficult intubation, initial encounter: T88.4XXA

## 2017-10-27 LAB — TYPE AND SCREEN
ABO/RH(D): O POS
ANTIBODY SCREEN: NEGATIVE

## 2017-10-27 LAB — ABO/RH: ABO/RH(D): O POS

## 2017-10-27 LAB — POCT PREGNANCY, URINE: PREG TEST UR: NEGATIVE

## 2017-10-27 SURGERY — HYSTERECTOMY, VAGINAL, LAPAROSCOPY-ASSISTED
Anesthesia: General

## 2017-10-27 MED ORDER — ONDANSETRON HCL 4 MG/2ML IJ SOLN
INTRAMUSCULAR | Status: DC | PRN
  Administered 2017-10-27: 4 mg via INTRAVENOUS

## 2017-10-27 MED ORDER — ARTIFICIAL TEARS OPHTHALMIC OINT
TOPICAL_OINTMENT | OPHTHALMIC | Status: AC
  Filled 2017-10-27: qty 3.5

## 2017-10-27 MED ORDER — LIDOCAINE HCL (PF) 2 % IJ SOLN
INTRAMUSCULAR | Status: AC
  Filled 2017-10-27: qty 10

## 2017-10-27 MED ORDER — DEXTROSE IN LACTATED RINGERS 5 % IV SOLN
INTRAVENOUS | Status: DC
  Administered 2017-10-27 – 2017-10-28 (×3): via INTRAVENOUS

## 2017-10-27 MED ORDER — CEFAZOLIN SODIUM-DEXTROSE 2-4 GM/100ML-% IV SOLN
INTRAVENOUS | Status: AC
  Filled 2017-10-27: qty 100

## 2017-10-27 MED ORDER — KETOROLAC TROMETHAMINE 30 MG/ML IJ SOLN
30.0000 mg | Freq: Four times a day (QID) | INTRAMUSCULAR | Status: DC
Start: 2017-10-27 — End: 2017-10-28
  Administered 2017-10-27 – 2017-10-28 (×3): 30 mg via INTRAVENOUS
  Filled 2017-10-27 (×3): qty 1

## 2017-10-27 MED ORDER — SUGAMMADEX SODIUM 200 MG/2ML IV SOLN
INTRAVENOUS | Status: AC
  Filled 2017-10-27: qty 2

## 2017-10-27 MED ORDER — SIMETHICONE 80 MG PO CHEW
80.0000 mg | CHEWABLE_TABLET | Freq: Four times a day (QID) | ORAL | Status: DC | PRN
  Administered 2017-10-28: 80 mg via ORAL
  Filled 2017-10-27: qty 1

## 2017-10-27 MED ORDER — SODIUM CHLORIDE 0.9 % IJ SOLN
INTRAMUSCULAR | Status: AC
  Filled 2017-10-27: qty 50

## 2017-10-27 MED ORDER — FENTANYL CITRATE (PF) 100 MCG/2ML IJ SOLN
25.0000 ug | INTRAMUSCULAR | Status: DC | PRN
  Administered 2017-10-27: 50 ug via INTRAVENOUS
  Administered 2017-10-27 (×2): 25 ug via INTRAVENOUS

## 2017-10-27 MED ORDER — HYDROMORPHONE HCL 1 MG/ML IJ SOLN
INTRAMUSCULAR | Status: AC
  Filled 2017-10-27: qty 2

## 2017-10-27 MED ORDER — FAMOTIDINE 20 MG PO TABS
ORAL_TABLET | ORAL | Status: AC
  Filled 2017-10-27: qty 1

## 2017-10-27 MED ORDER — MEPERIDINE HCL 50 MG/ML IJ SOLN: 6.2500 mg | INTRAMUSCULAR | Status: DC | PRN

## 2017-10-27 MED ORDER — ONDANSETRON HCL 4 MG/2ML IJ SOLN
INTRAMUSCULAR | Status: AC
Start: 2017-10-27 — End: 2017-10-27
  Filled 2017-10-27: qty 2

## 2017-10-27 MED ORDER — KETOROLAC TROMETHAMINE 30 MG/ML IJ SOLN: 30.0000 mg | Freq: Once | INTRAMUSCULAR | Status: DC

## 2017-10-27 MED ORDER — OXYCODONE HCL 5 MG PO TABS: 5.0000 mg | ORAL_TABLET | Freq: Once | ORAL | Status: DC | PRN

## 2017-10-27 MED ORDER — KETOROLAC TROMETHAMINE 30 MG/ML IJ SOLN: 30.0000 mg | Freq: Four times a day (QID) | INTRAMUSCULAR | Status: DC

## 2017-10-27 MED ORDER — ROCURONIUM BROMIDE 100 MG/10ML IV SOLN
INTRAVENOUS | Status: DC | PRN
  Administered 2017-10-27: 50 mg via INTRAVENOUS

## 2017-10-27 MED ORDER — PROPOFOL 10 MG/ML IV BOLUS
INTRAVENOUS | Status: DC | PRN
  Administered 2017-10-27: 120 mg via INTRAVENOUS

## 2017-10-27 MED ORDER — KETOROLAC TROMETHAMINE 30 MG/ML IJ SOLN
INTRAMUSCULAR | Status: DC | PRN
  Administered 2017-10-27: 30 mg via INTRAVENOUS

## 2017-10-27 MED ORDER — FENTANYL CITRATE (PF) 100 MCG/2ML IJ SOLN
INTRAMUSCULAR | Status: AC
  Administered 2017-10-27: 50 ug via INTRAVENOUS
  Filled 2017-10-27: qty 2

## 2017-10-27 MED ORDER — DEXAMETHASONE SODIUM PHOSPHATE 10 MG/ML IJ SOLN
INTRAMUSCULAR | Status: AC
  Filled 2017-10-27: qty 1

## 2017-10-27 MED ORDER — PROPOFOL 10 MG/ML IV BOLUS
INTRAVENOUS | Status: AC
Start: 2017-10-27 — End: 2017-10-27
  Filled 2017-10-27: qty 40

## 2017-10-27 MED ORDER — ROCURONIUM BROMIDE 50 MG/5ML IV SOLN
INTRAVENOUS | Status: AC
  Filled 2017-10-27: qty 1

## 2017-10-27 MED ORDER — HYDROMORPHONE HCL 1 MG/ML IJ SOLN
INTRAMUSCULAR | Status: DC | PRN
  Administered 2017-10-27 (×2): 1 mg via INTRAVENOUS

## 2017-10-27 MED ORDER — FAMOTIDINE 20 MG PO TABS
20.0000 mg | ORAL_TABLET | Freq: Once | ORAL | Status: AC
  Administered 2017-10-27: 20 mg via ORAL

## 2017-10-27 MED ORDER — PHENYLEPHRINE HCL 10 MG/ML IJ SOLN
INTRAMUSCULAR | Status: DC | PRN
  Administered 2017-10-27: 100 ug via INTRAVENOUS

## 2017-10-27 MED ORDER — ACETAMINOPHEN NICU IV SYRINGE 10 MG/ML
INTRAVENOUS | Status: AC
  Filled 2017-10-27: qty 1

## 2017-10-27 MED ORDER — MIDAZOLAM HCL 5 MG/5ML IJ SOLN
INTRAMUSCULAR | Status: AC
Start: 2017-10-27 — End: 2017-10-27
  Filled 2017-10-27: qty 5

## 2017-10-27 MED ORDER — OXYCODONE HCL 5 MG/5ML PO SOLN: 5.0000 mg | Freq: Once | ORAL | Status: DC | PRN

## 2017-10-27 MED ORDER — IBUPROFEN 600 MG PO TABS
600.0000 mg | ORAL_TABLET | Freq: Four times a day (QID) | ORAL | Status: DC | PRN
  Administered 2017-10-28: 600 mg via ORAL
  Filled 2017-10-27: qty 1

## 2017-10-27 MED ORDER — HYDROMORPHONE HCL 1 MG/ML IJ SOLN
0.2000 mg | INTRAMUSCULAR | Status: AC | PRN
  Administered 2017-10-27 – 2017-10-28 (×4): 0.4 mg via INTRAVENOUS
  Filled 2017-10-27 (×4): qty 1

## 2017-10-27 MED ORDER — LACTATED RINGERS IV SOLN
INTRAVENOUS | Status: DC
  Administered 2017-10-27 (×2): via INTRAVENOUS

## 2017-10-27 MED ORDER — VASOPRESSIN 20 UNIT/ML IV SOLN
INTRAVENOUS | Status: DC | PRN
  Administered 2017-10-27: 10 mL via INTRAMUSCULAR

## 2017-10-27 MED ORDER — VASOPRESSIN 20 UNIT/ML IV SOLN
INTRAVENOUS | Status: AC
  Filled 2017-10-27: qty 1

## 2017-10-27 MED ORDER — GLYCOPYRROLATE 0.2 MG/ML IJ SOLN
INTRAMUSCULAR | Status: DC | PRN
  Administered 2017-10-27: 0.1 mg via INTRAVENOUS

## 2017-10-27 MED ORDER — MIDAZOLAM HCL 2 MG/2ML IJ SOLN
INTRAMUSCULAR | Status: DC | PRN
  Administered 2017-10-27: 2 mg via INTRAVENOUS
  Administered 2017-10-27: 3 mg via INTRAVENOUS

## 2017-10-27 MED ORDER — DEXAMETHASONE SODIUM PHOSPHATE 10 MG/ML IJ SOLN
INTRAMUSCULAR | Status: DC | PRN
  Administered 2017-10-27: 10 mg via INTRAVENOUS

## 2017-10-27 MED ORDER — BUPIVACAINE HCL 0.5 % IJ SOLN
INTRAMUSCULAR | Status: DC | PRN
  Administered 2017-10-27: 10 mL

## 2017-10-27 MED ORDER — BUPIVACAINE HCL (PF) 0.5 % IJ SOLN
INTRAMUSCULAR | Status: AC
  Filled 2017-10-27: qty 30

## 2017-10-27 MED ORDER — PROMETHAZINE HCL 25 MG/ML IJ SOLN: 6.2500 mg | INTRAMUSCULAR | Status: DC | PRN

## 2017-10-27 MED ORDER — SUGAMMADEX SODIUM 200 MG/2ML IV SOLN
INTRAVENOUS | Status: DC | PRN
  Administered 2017-10-27: 200 mg via INTRAVENOUS

## 2017-10-27 MED ORDER — KETOROLAC TROMETHAMINE 30 MG/ML IJ SOLN
INTRAMUSCULAR | Status: AC
  Filled 2017-10-27: qty 1

## 2017-10-27 MED ORDER — ACETAMINOPHEN 10 MG/ML IV SOLN
INTRAVENOUS | Status: DC | PRN
  Administered 2017-10-27: 1000 mg via INTRAVENOUS

## 2017-10-27 MED ORDER — GLYCOPYRROLATE 0.2 MG/ML IJ SOLN
INTRAMUSCULAR | Status: AC
  Filled 2017-10-27: qty 1

## 2017-10-27 MED ORDER — LIDOCAINE HCL (CARDIAC) 20 MG/ML IV SOLN
INTRAVENOUS | Status: DC | PRN
  Administered 2017-10-27: 60 mg via INTRAVENOUS

## 2017-10-27 SURGICAL SUPPLY — 46 items
ADHESIVE MASTISOL STRL (MISCELLANEOUS) ×2 IMPLANT
BAG URINE DRAINAGE (UROLOGICAL SUPPLIES) ×3 IMPLANT
BLADE SURG 15 STRL LF DISP TIS (BLADE) ×1 IMPLANT
BLADE SURG 15 STRL SS (BLADE) ×1
BLADE SURG SZ11 CARB STEEL (BLADE) ×2 IMPLANT
CATH FOLEY 2WAY  5CC 16FR (CATHETERS) ×1
CATH URTH 16FR FL 2W BLN LF (CATHETERS) ×1 IMPLANT
CHLORAPREP W/TINT 26ML (MISCELLANEOUS) ×2 IMPLANT
DEFOGGER SCOPE WARMER CLEARIFY (MISCELLANEOUS) ×2 IMPLANT
DERMABOND ADVANCED (GAUZE/BANDAGES/DRESSINGS) ×1
DERMABOND ADVANCED .7 DNX12 (GAUZE/BANDAGES/DRESSINGS) ×1 IMPLANT
ELECT REM PT RETURN 9FT ADLT (ELECTROSURGICAL) ×2
ELECTRODE REM PT RTRN 9FT ADLT (ELECTROSURGICAL) ×1 IMPLANT
GLOVE BIOGEL PI ORTHO PRO 7.5 (GLOVE) ×2
GLOVE PI ORTHO PRO STRL 7.5 (GLOVE) ×1 IMPLANT
GLOVE SURG SYN 8.0 (GLOVE) ×4 IMPLANT
GLOVE SURG SYN 8.0 PF PI (GLOVE) ×2 IMPLANT
GOWN STRL REUS W/ TWL LRG LVL3 (GOWN DISPOSABLE) ×1 IMPLANT
GOWN STRL REUS W/ TWL XL LVL3 (GOWN DISPOSABLE) ×2 IMPLANT
GOWN STRL REUS W/TWL LRG LVL3 (GOWN DISPOSABLE) ×1
GOWN STRL REUS W/TWL XL LVL3 (GOWN DISPOSABLE) ×2
HANDLE YANKAUER SUCT BULB TIP (MISCELLANEOUS) ×2 IMPLANT
IRRIGATION STRYKERFLOW (MISCELLANEOUS) IMPLANT
IRRIGATOR STRYKERFLOW (MISCELLANEOUS)
IV LACTATED RINGERS 1000ML (IV SOLUTION) ×2 IMPLANT
KIT PINK PAD W/HEAD ARE REST (MISCELLANEOUS) ×2
KIT PINK PAD W/HEAD ARM REST (MISCELLANEOUS) ×1 IMPLANT
KIT TURNOVER CYSTO (KITS) ×2 IMPLANT
LIGASURE LAP MARYLAND 5MM 37CM (ELECTROSURGICAL) ×1 IMPLANT
NEEDLE HYPO 22GX1.5 SAFETY (NEEDLE) ×1 IMPLANT
PACK BASIN MINOR ARMC (MISCELLANEOUS) ×2 IMPLANT
PACK GYN LAPAROSCOPIC (MISCELLANEOUS) ×2 IMPLANT
PAD OB MATERNITY 4.3X12.25 (PERSONAL CARE ITEMS) ×2 IMPLANT
SLEEVE ENDOPATH XCEL 5M (ENDOMECHANICALS) ×4 IMPLANT
SOLUTION ELECTROLUBE (MISCELLANEOUS) ×2 IMPLANT
SPONGE LAP 18X18 5 PK (GAUZE/BANDAGES/DRESSINGS) ×2 IMPLANT
STRIP CLOSURE SKIN 1/2X4 (GAUZE/BANDAGES/DRESSINGS) ×1 IMPLANT
SUT VIC AB 0 CT1 27 (SUTURE) ×3
SUT VIC AB 0 CT1 27XCR 8 STRN (SUTURE) ×2 IMPLANT
SUT VIC AB 0 CT1 36 (SUTURE) ×2 IMPLANT
SUT VIC AB 4-0 FS2 27 (SUTURE) ×2 IMPLANT
SYR 10ML LL (SYRINGE) ×2 IMPLANT
SYR CONTROL 10ML (SYRINGE) ×1 IMPLANT
TAPE TRANSPORE STRL 2 31045 (GAUZE/BANDAGES/DRESSINGS) ×2 IMPLANT
TROCAR XCEL NON-BLD 5MMX100MML (ENDOMECHANICALS) ×2 IMPLANT
TUBING INSUF HEATED (TUBING) ×2 IMPLANT

## 2017-10-27 NOTE — Anesthesia Postprocedure Evaluation (Signed)
Anesthesia Post Note  Patient: Tasha Simmons  Procedure(s) Performed: LAPAROSCOPIC ASSISTED VAGINAL HYSTERECTOMY (N/A )  Patient location during evaluation: PACU Anesthesia Type: General Level of consciousness: awake and alert and oriented Pain management: pain level controlled Vital Signs Assessment: post-procedure vital signs reviewed and stable Respiratory status: spontaneous breathing, nonlabored ventilation and respiratory function stable Cardiovascular status: blood pressure returned to baseline and stable Postop Assessment: no signs of nausea or vomiting Anesthetic complications: no     Last Vitals:  Vitals:   10/27/17 1226 10/27/17 1406  BP: 122/74 132/77  Pulse: 65 (!) 56  Resp: 16 18  Temp: 36.7 C 36.9 C  SpO2: 99% 99%    Last Pain:  Vitals:   10/27/17 1406  TempSrc: Oral  PainSc:                  Tresten Pantoja

## 2017-10-27 NOTE — Anesthesia Procedure Notes (Signed)
Procedure Name: Intubation Date/Time: 10/27/2017 7:53 AM Performed by: Nile Riggs, CRNA Pre-anesthesia Checklist: Patient identified, Emergency Drugs available, Suction available, Patient being monitored and Timeout performed Patient Re-evaluated:Patient Re-evaluated prior to induction Oxygen Delivery Method: Circle system utilized Preoxygenation: Pre-oxygenation with 100% oxygen Induction Type: IV induction Ventilation: Mask ventilation without difficulty Laryngoscope Size: Miller and 2 Grade View: Grade III Tube type: Oral Tube size: 7.0 mm Number of attempts: 1 Airway Equipment and Method: Stylet Placement Confirmation: ETT inserted through vocal cords under direct vision,  positive ETCO2,  CO2 detector and breath sounds checked- equal and bilateral Secured at: 22 cm Tube secured with: Tape Dental Injury: Teeth and Oropharynx as per pre-operative assessment  Difficulty Due To: Difficulty was anticipated and Difficult Airway- due to reduced neck mobility

## 2017-10-27 NOTE — Anesthesia Post-op Follow-up Note (Signed)
Anesthesia QCDR form completed.        

## 2017-10-27 NOTE — Interval H&P Note (Signed)
History and Physical Interval Note:  10/27/2017 7:35 AM  Tasha Simmons  has presented today for surgery, with the diagnosis of CHRONIC BLOOD LOSS ANEMIA, FIBROIDS, MENORRHAGIA  The various methods of treatment have been discussed with the patient and family. After consideration of risks, benefits and other options for treatment, the patient has consented to  Procedure(s): LAPAROSCOPIC ASSISTED VAGINAL HYSTERECTOMY (N/A) as a surgical intervention .  The patient's history has been reviewed, patient examined, no change in status, stable for surgery.  I have reviewed the patient's chart and labs.  Questions were answered to the patient's satisfaction.     Jeannie Fend

## 2017-10-27 NOTE — Transfer of Care (Signed)
Immediate Anesthesia Transfer of Care Note  Patient: Tasha Simmons  Procedure(s) Performed: LAPAROSCOPIC ASSISTED VAGINAL HYSTERECTOMY (N/A )  Patient Location: PACU  Anesthesia Type:General  Level of Consciousness: awake, alert , oriented and patient cooperative  Airway & Oxygen Therapy: Patient Spontanous Breathing and Patient connected to face mask oxygen  Post-op Assessment: Report given to RN, Post -op Vital signs reviewed and stable and Patient moving all extremities  Post vital signs: Reviewed and stable  Last Vitals:  Vitals Value Taken Time  BP 133/95 10/27/2017 10:35 AM  Temp    Pulse 75 10/27/2017 10:39 AM  Resp 15 10/27/2017 10:39 AM  SpO2 100 % 10/27/2017 10:39 AM  Vitals shown include unvalidated device data.  Last Pain:  Vitals:   10/27/17 0615  TempSrc: Tympanic  PainSc: 0-No pain         Complications: No apparent anesthesia complications

## 2017-10-27 NOTE — Anesthesia Preprocedure Evaluation (Signed)
Anesthesia Evaluation  Patient identified by MRN, date of birth, ID band Patient awake    Reviewed: Allergy & Precautions, NPO status , Patient's Chart, lab work & pertinent test results  History of Anesthesia Complications Negative for: history of anesthetic complications  Airway Mallampati: II  TM Distance: >3 FB Neck ROM: Full    Dental no notable dental hx.    Pulmonary neg sleep apnea, neg COPD, former smoker,    breath sounds clear to auscultation- rhonchi (-) wheezing      Cardiovascular Exercise Tolerance: Good (-) hypertension(-) CAD, (-) Past MI, (-) Cardiac Stents and (-) CABG  Rhythm:Regular Rate:Normal - Systolic murmurs and - Diastolic murmurs    Neuro/Psych negative neurological ROS  negative psych ROS   GI/Hepatic Neg liver ROS, GERD  ,  Endo/Other  negative endocrine ROSneg diabetes  Renal/GU negative Renal ROS     Musculoskeletal negative musculoskeletal ROS (+)   Abdominal (+) - obese,   Peds  Hematology  (+) anemia ,   Anesthesia Other Findings Past Medical History: No date: Acid reflux No date: Anemia No date: Benign breast cyst in female     Comment:  5 years ago.  No date: Deafness in left ear No date: Duodenitis No date: Heart murmur No date: Hyperlipidemia No date: Thrombocytosis (HCC) No date: Uterine fibroid   Reproductive/Obstetrics                             Anesthesia Physical Anesthesia Plan  ASA: II  Anesthesia Plan: General   Post-op Pain Management:    Induction: Intravenous  PONV Risk Score and Plan: 2 and Ondansetron, Dexamethasone and Midazolam  Airway Management Planned: Oral ETT  Additional Equipment:   Intra-op Plan:   Post-operative Plan: Extubation in OR  Informed Consent: I have reviewed the patients History and Physical, chart, labs and discussed the procedure including the risks, benefits and alternatives for the  proposed anesthesia with the patient or authorized representative who has indicated his/her understanding and acceptance.   Dental advisory given  Plan Discussed with: CRNA and Anesthesiologist  Anesthesia Plan Comments:         Anesthesia Quick Evaluation

## 2017-10-27 NOTE — Op Note (Addendum)
OPERATIVE NOTE 10/27/2017 10:21 AM  PRE-OPERATIVE DIAGNOSIS:  1) CHRONIC BLOOD LOSS ANEMIA, FIBROIDS, MENORRHAGIA  POST-OPERATIVE DIAGNOSIS:  1) LARGE UTERINE FIBROIDS  OPERATION: Procedure(s) (LRB): LAPAROSCOPIC ASSISTED VAGINAL HYSTERECTOMY (N/A)     SURGEON(S): Surgeon(s) and Role:    Harlin Heys, MD - Primary    * Rubie Maid, MD - Assisting   ANESTHESIA: General  ESTIMATED BLOOD LOSS: 350 mL  OPERATIVE FINDINGS: Large uterus with large fibroids periods greater than 300 g  SPECIMEN:  ID Type Source Tests Collected by Time Destination  1 : uterus with cervix, bilateral tubes Tissue [facility] benign resection SURGICAL PATHOLOGY Harlin Heys, MD 08/07/5091 2671     COMPLICATIONS: None  DRAINS: Foley to gravity  DISPOSITION: Stable to recovery room  DESCRIPTION OF PROCEDURE:      The patient was prepped and draped in the dorsolithotomy position and placed under general anesthesia. The bladder was emptied. The cervix was grasped with a multi-toothed tenaculum and a uterine manipulator was placed within the cervical os respecting the position and curvature of the uterus. After changing gloves we proceeded abdominally. A small infraumbilical incision was made and a 5 mm trocar port was placed within the abdominopelvic cavity. The opening pressure was less than 7 mmHg.  Approximately 3 and 1/2 L of carbon dioxide gas was instilled within the abdominal pelvic cavity. The laparoscope was placed and the pelvis and abdomen were carefully inspected. In the usual manner, under direct visualization right and left lower quadrant ports of 5 mm size were placed. Both ureters were identified in the pelvis prior to dissection or clamping and cutting of pedicles. The fallopian tubes were elevated and the mesenteric side systematically coagulated and divided allowing the tube to be removed at the time of uterine removal. The round ligaments were coagulated and divided and a  bladder flap was created. The upper aspect of the broad ligament was clamped coagulated and divided. The uterine arteries were skeletonized, triply coagulated and divided. Careful inspection of all pedicles and the remainder of the pelvis was performed. Hemostasis was noted. We then proceeded vaginally. A weighted speculum was placed posteriorly. A multi-toothed tenaculum was used to grasp the cervix and the cervix was injected in a circumferential manner with a dilute Pitressin solution. An incision was made around the cervix and the vaginal mucosa was dissected off of the cervix. The posterior cul-de-sac was identified and entered and the weighted speculum was placed within this. The anterior cul-de-sac was identified and entered and a retractor was placed and used to retract the bladder anteriorly keeping it out of the operative field. The uterosacral ligaments were clamped divided and suture ligated. The cardinal ligaments were clamped divided and suture ligated. The small remaining pedicle was clamped divided and suture ligated bilaterally. Despite the uterus being free we were unable to deliver the specimen vaginally.  A midline bivalving type incision was made and we were then able to identify individual large fibroids.  Grasping the fibroid with a multitooth tenaculum it was carefully dissected free and delivered separate from the uterus.  This was repeated 2 more times with other fibroids.  We were then able to deliver the entire specimen. Angle sutures were placed in the manner. A culdoplasty was performed. The peritoneum was identified anteriorly and then incorporating the left upper pedicle left lower pedicle right lower pedicle right upper pedicle and anterior peritoneum a pursestring suture was placed exteriorizing all pedicles. Hemostasis of all pedicles was  noted at this time. The vaginal mucosa was then closed with a running suture of Vicryl. We then proceeded back abdominally through the  laparoscope.  We carefully inspected the pelvis and all pedicles.  The pelvic cavity was suctioned free of blood.  Hemostasis was noted. The lower quadrant ports were removed, hemostasis of the port sites was noted, and the incisions were closed in subcuticular manner. The laparoscope and trocar sleeve were removed from the infraumbilical incision, hemostasis was noted, and the incision was closed in a subcuticular manner. A long-acting anesthetic was employed in the skin incisions. The patient went to recovery room in stable condition. Clear urine was noted in the Foley at the conclusion of the procedure.  Finis Bud, M.D. 10/27/2017 10:21 AM

## 2017-10-28 DIAGNOSIS — R011 Cardiac murmur, unspecified: Secondary | ICD-10-CM | POA: Diagnosis not present

## 2017-10-28 DIAGNOSIS — K219 Gastro-esophageal reflux disease without esophagitis: Secondary | ICD-10-CM | POA: Diagnosis not present

## 2017-10-28 DIAGNOSIS — Z87891 Personal history of nicotine dependence: Secondary | ICD-10-CM | POA: Diagnosis not present

## 2017-10-28 DIAGNOSIS — E785 Hyperlipidemia, unspecified: Secondary | ICD-10-CM | POA: Diagnosis not present

## 2017-10-28 DIAGNOSIS — Z79899 Other long term (current) drug therapy: Secondary | ICD-10-CM | POA: Diagnosis not present

## 2017-10-28 DIAGNOSIS — D259 Leiomyoma of uterus, unspecified: Secondary | ICD-10-CM | POA: Diagnosis not present

## 2017-10-28 DIAGNOSIS — N838 Other noninflammatory disorders of ovary, fallopian tube and broad ligament: Secondary | ICD-10-CM | POA: Diagnosis not present

## 2017-10-28 DIAGNOSIS — N72 Inflammatory disease of cervix uteri: Secondary | ICD-10-CM | POA: Diagnosis not present

## 2017-10-28 DIAGNOSIS — Z8249 Family history of ischemic heart disease and other diseases of the circulatory system: Secondary | ICD-10-CM | POA: Diagnosis not present

## 2017-10-28 DIAGNOSIS — H9192 Unspecified hearing loss, left ear: Secondary | ICD-10-CM | POA: Diagnosis not present

## 2017-10-28 DIAGNOSIS — Z825 Family history of asthma and other chronic lower respiratory diseases: Secondary | ICD-10-CM | POA: Diagnosis not present

## 2017-10-28 DIAGNOSIS — D500 Iron deficiency anemia secondary to blood loss (chronic): Secondary | ICD-10-CM | POA: Diagnosis not present

## 2017-10-28 DIAGNOSIS — Z8349 Family history of other endocrine, nutritional and metabolic diseases: Secondary | ICD-10-CM | POA: Diagnosis not present

## 2017-10-28 DIAGNOSIS — K2980 Duodenitis without bleeding: Secondary | ICD-10-CM | POA: Diagnosis not present

## 2017-10-28 DIAGNOSIS — N920 Excessive and frequent menstruation with regular cycle: Secondary | ICD-10-CM | POA: Diagnosis not present

## 2017-10-28 LAB — SURGICAL PATHOLOGY

## 2017-10-28 NOTE — Progress Notes (Signed)
Patient discharged home. Discharge instructions, prescriptions and follow up appointment given to and reviewed with patient. Patient verbalized understanding. Patient wheeled out by auxiliary.  

## 2017-10-28 NOTE — Plan of Care (Signed)
Vs stable; able to advance diet to regular; taking toradol and dilaudid for pain control; ask about PO meds; foley removed this shift; will be up with assist

## 2017-10-28 NOTE — Discharge Summary (Signed)
    Discharge Summary  Admit date: 10/27/2017  Discharge Date and Time:10/28/2017  1:47 PM  Discharge to:  Home  Admission Diagnosis: Present on Admission: **None**                     Discharge  Diagnoses: Active Problems:   Post-operative state   OR Procedures:   Procedure(s): LAPAROSCOPIC ASSISTED VAGINAL HYSTERECTOMY Date -------------------                              Discharge Day Progress Note:   Subjective:   The patient does not have complaints.  She is ambulating well. She is taking PO well. Her pain is well controlled with her current medications. She is urinating without difficulty and is passing flatus.  She desires discharge today.   Objective:  BP 128/80 (BP Location: Right Arm)   Pulse 60   Temp 98.2 F (36.8 C)   Resp 18   Ht 5\' 1"  (1.549 m)   Wt 134 lb (60.8 kg)   LMP 10/20/2017 (Exact Date)   SpO2 100%   BMI 25.32 kg/m     Abdomen:                         clean, dry, no drainage    Assessment:   Doing well.  Normal progress as expected.     Plan:        Discharge home.                       Medications as directed.  Hospital Course: She has done very well postop.  Condition at Discharge:  good Discharge Medications:     Follow Up:   Follow-up Information    Harlin Heys, MD Follow up in 1 week(s).   Specialty:  Obstetrics and Gynecology Contact information: [address] [place] [address] [phone]           Finis Bud, M.D. 10/28/2017 1:47 PM

## 2017-11-04 ENCOUNTER — Ambulatory Visit (INDEPENDENT_AMBULATORY_CARE_PROVIDER_SITE_OTHER): Payer: BLUE CROSS/BLUE SHIELD | Admitting: Obstetrics and Gynecology

## 2017-11-04 ENCOUNTER — Encounter: Payer: Self-pay | Admitting: Obstetrics and Gynecology

## 2017-11-04 VITALS — BP 111/77 | HR 97 | Ht 61.0 in | Wt 131.3 lb

## 2017-11-04 DIAGNOSIS — Z9889 Other specified postprocedural states: Secondary | ICD-10-CM

## 2017-11-04 NOTE — Progress Notes (Signed)
HPI:      [name] is a 47 y.o. [id number] who LMP was Patient's last menstrual period was 10/20/2017 (exact date).  Subjective:   She presents today 1 week from laparoscopic hysterectomy.  She reports she is doing very well.  Having minimal pain, denies vaginal bleeding.  Eating voiding and having bowel movements without difficulty.    Hx: The following portions of the patient's history were reviewed and updated as appropriate:             She  has a past medical history of Acid reflux, Anemia, Benign breast cyst in female, Deafness in left ear, Difficult intubation, Duodenitis, Heart murmur, Hyperlipidemia, Thrombocytosis ([place]), and Uterine fibroid. She does not have any pertinent problems on file. She  has a past surgical history that includes Colonoscopy with propofol (N/A, 06/28/2016); Esophagogastroduodenoscopy (egd) with propofol (N/A, 06/28/2016); Breast biopsy (Left, 7 years ago); Back surgery; and Laparoscopic assisted vaginal hysterectomy (N/A, 10/27/2017). Her family history includes COPD in her mother; Heart disease in her father and maternal grandmother; Hyperlipidemia in her father and mother; Hypertension in her mother; Thyroid disease in her sister. She  reports that she quit smoking about 10 months ago. She has a 13.00 pack-year smoking history. She has never used smokeless tobacco. She reports that she does not drink alcohol or use drugs. She has a current medication list which includes the following prescription(s): ferrous sulfate and ibuprofen. She has No Known Allergies.       Review of Systems:  Review of Systems  Constitutional: Denied constitutional symptoms, night sweats, recent illness, fatigue, fever, insomnia and weight loss.  Eyes: Denied eye symptoms, eye pain, photophobia, vision change and visual disturbance.  Ears/Nose/Throat/Neck: Denied ear, nose, throat or neck symptoms, hearing loss, nasal discharge, sinus congestion and sore throat.  Cardiovascular:  Denied cardiovascular symptoms, arrhythmia, chest pain/pressure, edema, exercise intolerance, orthopnea and palpitations.  Respiratory: Denied pulmonary symptoms, asthma, pleuritic pain, productive sputum, cough, dyspnea and wheezing.  Gastrointestinal: Denied, gastro-esophageal reflux, melena, nausea and vomiting.  Genitourinary: Denied genitourinary symptoms including symptomatic vaginal discharge, pelvic relaxation issues, and urinary complaints.  Musculoskeletal: Denied musculoskeletal symptoms, stiffness, swelling, muscle weakness and myalgia.  Dermatologic: Denied dermatology symptoms, rash and scar.  Neurologic: Denied neurology symptoms, dizziness, headache, neck pain and syncope.  Psychiatric: Denied psychiatric symptoms, anxiety and depression.  Endocrine: Denied endocrine symptoms including hot flashes and night sweats.   Meds:   Current Outpatient Medications on File Prior to Visit  Medication Sig Dispense Refill  . ferrous sulfate 325 (65 FE) MG tablet Take 325 mg by mouth daily.     Marland Kitchen ibuprofen (ADVIL,MOTRIN) 200 MG tablet Take 200 mg by mouth every 6 (six) hours as needed.     No current facility-administered medications on file prior to visit.     Objective:     Vitals:   11/04/17 1058  BP: 111/77  Pulse: 97               Abdomen: Soft.  Non-tender.  No masses.  No HSM.  Incision/s: Intact.  Healing well.  No erythema.  No drainage.      Assessment:    [id number] Patient Active Problem List   Diagnosis Date Noted  . Post-operative state 10/27/2017  . Thrombocytosis ([place]) 06/17/2017  . Uterine fibroid 04/04/2017  . 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 Heart murmur, systolic 32/95/1884  . Hyperlipidemia 09/09/2016  . Duodenitis   . Polyp of duodenum   . 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 Iron deficiency anemia 02/12/2016  . Benign cyst of left breast 07/07/2015  . Gastroesophageal reflux disease without  esophagitis 07/07/2015     1. Post-operative state     Patient with excellent recovery so far.   Plan:            1.  May resume most normal activities with the exception of heavy lifting or intercourse.  We discussed the use of ibuprofen for pain relief. Orders No orders of the defined types were placed in this encounter.   No orders of the defined types were placed in this encounter.     F/U  Return in about 1 month (around 12/02/2017).  Finis Bud, M.D. 11/04/2017 11:20 AM

## 2017-11-12 DIAGNOSIS — H6980 Other specified disorders of Eustachian tube, unspecified ear: Secondary | ICD-10-CM | POA: Diagnosis not present

## 2017-11-12 DIAGNOSIS — H9042 Sensorineural hearing loss, unilateral, left ear, with unrestricted hearing on the contralateral side: Secondary | ICD-10-CM | POA: Diagnosis not present

## 2017-11-12 DIAGNOSIS — J301 Allergic rhinitis due to pollen: Secondary | ICD-10-CM | POA: Diagnosis not present

## 2017-11-18 LAB — COMP PANEL: LEUKEMIA/LYMPHOMA

## 2017-12-02 ENCOUNTER — Encounter: Payer: Self-pay | Admitting: Obstetrics and Gynecology

## 2017-12-02 ENCOUNTER — Ambulatory Visit: Payer: BLUE CROSS/BLUE SHIELD | Admitting: Obstetrics and Gynecology

## 2017-12-02 VITALS — BP 125/86 | HR 81 | Ht 61.0 in | Wt 135.8 lb

## 2017-12-02 DIAGNOSIS — Z9889 Other specified postprocedural states: Secondary | ICD-10-CM

## 2017-12-02 NOTE — Progress Notes (Signed)
HPI:      [name] is a 47 y.o. [id number] who LMP was Patient's last menstrual period was 10/20/2017 (exact date).  Subjective:   She presents today approximately 6 weeks postop from [facility] for uterine fibroids and anemia.  She is doing well.  She has no complaints.  Voiding and bowel movements without issue.  Eating without problem.  Reports no vaginal bleeding.  Patient would like to go back to work.    Hx: The following portions of the patient's history were reviewed and updated as appropriate:             She  has a past medical history of Acid reflux, Anemia, Benign breast cyst in female, Deafness in left ear, Difficult intubation, Duodenitis, Heart murmur, Hyperlipidemia, Thrombocytosis ([place]), and Uterine fibroid. She does not have any pertinent problems on file. She  has a past surgical history that includes Colonoscopy with propofol (N/A, 06/28/2016); Esophagogastroduodenoscopy (egd) with propofol (N/A, 06/28/2016); Breast biopsy (Left, 7 years ago); Back surgery; and Laparoscopic assisted vaginal hysterectomy (N/A, 10/27/2017). Her family history includes COPD in her mother; Heart disease in her father and maternal grandmother; Hyperlipidemia in her father and mother; Hypertension in her mother; Thyroid disease in her sister. She  reports that she quit smoking about a year ago. She has a 13.00 pack-year smoking history. She has never used smokeless tobacco. She reports that she does not drink alcohol or use drugs. She has a current medication list which includes the following prescription(s): ferrous sulfate. She has No Known Allergies.       Review of Systems:  Review of Systems  Constitutional: Denied constitutional symptoms, night sweats, recent illness, fatigue, fever, insomnia and weight loss.  Eyes: Denied eye symptoms, eye pain, photophobia, vision change and visual disturbance.  Ears/Nose/Throat/Neck: Denied ear, nose, throat or neck symptoms, hearing loss, nasal discharge,  sinus congestion and sore throat.  Cardiovascular: Denied cardiovascular symptoms, arrhythmia, chest pain/pressure, edema, exercise intolerance, orthopnea and palpitations.  Respiratory: Denied pulmonary symptoms, asthma, pleuritic pain, productive sputum, cough, dyspnea and wheezing.  Gastrointestinal: Denied, gastro-esophageal reflux, melena, nausea and vomiting.  Genitourinary: See HPI for additional information.  Musculoskeletal: Denied musculoskeletal symptoms, stiffness, swelling, muscle weakness and myalgia.  Dermatologic: Denied dermatology symptoms, rash and scar.  Neurologic: Denied neurology symptoms, dizziness, headache, neck pain and syncope.  Psychiatric: Denied psychiatric symptoms, anxiety and depression.  Endocrine: Denied endocrine symptoms including hot flashes and night sweats.   Meds:   Current Outpatient Medications on File Prior to Visit  Medication Sig Dispense Refill  . ferrous sulfate 325 (65 FE) MG tablet Take 325 mg by mouth daily.      No current facility-administered medications on file prior to visit.     Objective:     Vitals:   12/02/17 0902  BP: 125/86  Pulse: 81               Abdomen: Soft.  Non-tender.  No masses.  No HSM.  Incision/s: Intact.  Healing well.  No erythema.  No drainage.    Pelvic:   Vulva: Normal appearance.  No lesions.  Vagina: No lesions or abnormalities noted.  Support: Normal pelvic support.  Urethra No masses tenderness or scarring.  Meatus Normal size without lesions or prolapse  Vag Cuff: Intact.  No lesions.  Anus: Normal exam.  No lesions.  Perineum: Normal exam.  No lesions.        Bimanual   Adnexae: No masses.  Non-tender to palpation.  Cuff: Negative for abnormality.      Assessment:    [id number] Patient Active Problem List   Diagnosis Date Noted  . Post-operative state 10/27/2017  . Thrombocytosis ([place]) 06/17/2017  . Uterine fibroid 04/04/2017  . 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 Heart  murmur, systolic 61/44/3154  . Hyperlipidemia 09/09/2016  . Duodenitis   . Polyp of duodenum   . 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 Iron deficiency anemia 02/12/2016  . Benign cyst of left breast 07/07/2015  . Gastroesophageal reflux disease without esophagitis 07/07/2015     1. Post-operative state     Patient with excellent recovery.  Desires return to work.   Plan:            1.  Patient may resume normal activities with exception of heavy lifting.  Return to work letter given.   orders No orders of the defined types were placed in this encounter.   No orders of the defined types were placed in this encounter.     F/U  Return in about 3 months (around 03/04/2018).  Finis Bud, M.D. 12/02/2017 9:20 AM

## 2017-12-09 NOTE — Progress Notes (Signed)
[facility]  [phone] [phone]) [phone]  ID: Tasha Simmons OB: [birth year]  MR#: [id number]  BWI#:[id number]  Patient Care Team: Mikey College, NP as PCP - General (Nurse Practitioner)  CHIEF COMPLAINT: Thrombocytosis.  INTERVAL HISTORY: Patient returns to clinic today for repeat laboratory work and further evaluation.  She continues to feel well and remains asymptomatic. She has no neurologic complaints.  She denies any recent fevers or illnesses.  She has a good appetite and denies weight loss.  She has no chest pain or shortness of breath.  She denies any nausea, vomiting, constipation, or diarrhea.  She has no melena or hematochezia.  She has no urinary complaints.  Patient offers no specific complaints today.  REVIEW OF SYSTEMS:   Review of Systems  Constitutional: Negative.  Negative for fever, malaise/fatigue and weight loss.  Respiratory: Negative.  Negative for cough, hemoptysis and shortness of breath.   Cardiovascular: Negative.  Negative for chest pain and leg swelling.  Gastrointestinal: Negative.  Negative for abdominal pain.  Genitourinary: Negative.  Negative for dysuria.  Musculoskeletal: Negative.   Skin: Negative.  Negative for rash.  Neurological: Negative.  Negative for sensory change and weakness.  Psychiatric/Behavioral: Negative.  The patient is not nervous/anxious.     As per HPI. Otherwise, a complete review of systems is negative.  PAST MEDICAL HISTORY: Past Medical History:  Diagnosis Date  . Acid reflux   . Anemia   . Benign breast cyst in female    5 years ago.   Marland Kitchen Deafness in left ear   . Difficult intubation    Very limited ROM in neck, large overbite. Grade III view with MIL 2 and cricoid pressure. Otherwise atraumatic.  . Duodenitis   . Heart murmur   . Hyperlipidemia   . Thrombocytosis ([place])   . Uterine fibroid     PAST SURGICAL HISTORY: Past Surgical History:  Procedure Laterality Date  .  BACK SURGERY     scoliosis  . BREAST BIOPSY Left 7 years ago   Benign no scar  . COLONOSCOPY WITH PROPOFOL N/A 06/28/2016   Procedure: COLONOSCOPY WITH PROPOFOL;  Surgeon: Jonathon Bellows, MD;  Location: [facility] ENDOSCOPY;  Service: Endoscopy;  Laterality: N/A;  . ESOPHAGOGASTRODUODENOSCOPY (EGD) WITH PROPOFOL N/A 06/28/2016   Procedure: ESOPHAGOGASTRODUODENOSCOPY (EGD) WITH PROPOFOL;  Surgeon: Jonathon Bellows, MD;  Location: [facility] ENDOSCOPY;  Service: Endoscopy;  Laterality: N/A;  . LAPAROSCOPIC ASSISTED VAGINAL HYSTERECTOMY N/A 10/27/2017   Procedure: LAPAROSCOPIC ASSISTED VAGINAL HYSTERECTOMY;  Surgeon: Harlin Heys, MD;  Location: [facility] ORS;  Service: Gynecology;  Laterality: N/A;    FAMILY HISTORY: Family History  Problem Relation Age of Onset  . Hyperlipidemia Mother   . Hypertension Mother   . COPD Mother   . Heart disease Father   . Hyperlipidemia Father   . Heart disease Maternal Grandmother   . Thyroid disease Sister   . Breast cancer Neg Hx     ADVANCED DIRECTIVES (Y/N):  N  HEALTH MAINTENANCE: Social History   Tobacco Use  . Smoking status: Former Smoker    Packs/day: 1.00    Years: 13.00    Pack years: 13.00    Last attempt to quit: 12/15/2016    Years since quitting: 0.9  . Smokeless tobacco: Never Used  . Tobacco comment: Still battling cravings  Substance Use Topics  . Alcohol use: No  . Drug use: No     Colonoscopy:  PAP:  Bone density:  Lipid panel:  No Known Allergies  Current Outpatient Medications  Medication Sig Dispense Refill  . ferrous sulfate 325 (65 FE) MG tablet Take 325 mg by mouth daily.      No current facility-administered medications for this visit.     OBJECTIVE: There were no vitals filed for this visit.   There is no height or weight on file to calculate BMI.    ECOG FS:0 - Asymptomatic  General: Well-developed, well-nourished, no acute distress. Eyes: Pink conjunctiva, anicteric sclera. Lungs: Clear to auscultation  bilaterally. Heart: Regular rate and rhythm. No rubs, murmurs, or gallops. Abdomen: Soft, nontender, nondistended. No organomegaly noted, normoactive bowel sounds. Musculoskeletal: No edema, cyanosis, or clubbing. Neuro: Alert, answering all questions appropriately. Cranial nerves grossly intact. Skin: No rashes or petechiae noted. Psych: Normal affect.   LAB RESULTS:  Lab Results  Component Value Date   NA 137 03/10/2017   K 4.2 03/10/2017   CL 108 03/10/2017   CO2 18 (L) 03/10/2017   GLUCOSE 83 03/10/2017   BUN 12 03/10/2017   CREATININE 0.58 03/10/2017   CALCIUM 8.7 03/10/2017   PROT 6.4 03/10/2017   ALBUMIN 3.8 03/10/2017   AST 12 03/10/2017   ALT 8 03/10/2017   ALKPHOS 45 03/10/2017   BILITOT 0.3 03/10/2017   GFRNONAA >89 03/10/2017   GFRAA >89 03/10/2017    Lab Results  Component Value Date   WBC 9.0 12/12/2017   NEUTROABS 5.5 12/12/2017   HGB 14.0 12/12/2017   HCT 41.0 12/12/2017   MCV 86.2 12/12/2017   PLT 406 12/12/2017     STUDIES: No results found.  ASSESSMENT: Thrombocytosis.  PLAN:    1. Thrombocytosis: Resolved.  Patient platelet count is now within normal limits.  Previously, all of her other laboratory work was either negative or within normal limits as well including Jak 2 mutation.  No intervention is needed at this time.  No further follow-up has been scheduled.  Please refer patient back if there are any questions or concerns. 2.  Elevated iron saturation: Likely clinically insignificant.  If iron stores are hemoglobin begin to increase, please refer patient back for further evaluation and consideration of hemochromatosis testing.  Approximately 20 minutes was spent in discussion of which greater than 50% was consultation.   Patient expressed understanding and was in agreement with this plan. She also understands that She can call clinic at any time with any questions, concerns, or complaints.    Lloyd Huger, MD   12/13/2017 11:12  AM

## 2017-12-12 ENCOUNTER — Inpatient Hospital Stay: Payer: BLUE CROSS/BLUE SHIELD | Attending: Oncology | Admitting: Oncology

## 2017-12-12 ENCOUNTER — Inpatient Hospital Stay: Payer: BLUE CROSS/BLUE SHIELD

## 2017-12-12 DIAGNOSIS — D75839 Thrombocytosis, unspecified: Secondary | ICD-10-CM

## 2017-12-12 DIAGNOSIS — D473 Essential (hemorrhagic) thrombocythemia: Secondary | ICD-10-CM

## 2017-12-12 DIAGNOSIS — R7989 Other specified abnormal findings of blood chemistry: Secondary | ICD-10-CM | POA: Insufficient documentation

## 2017-12-12 LAB — CBC WITH DIFFERENTIAL/PLATELET
Basophils Absolute: 0.1 10*3/uL (ref 0–0.1)
Basophils Relative: 1 %
EOS ABS: 0.1 10*3/uL (ref 0–0.7)
EOS PCT: 2 %
HCT: 41 % (ref 35.0–47.0)
Hemoglobin: 14 g/dL (ref 12.0–16.0)
LYMPHS ABS: 2.5 10*3/uL (ref 1.0–3.6)
LYMPHS PCT: 28 %
MCH: 29.4 pg (ref 26.0–34.0)
MCHC: 34.1 g/dL (ref 32.0–36.0)
MCV: 86.2 fL (ref 80.0–100.0)
MONOS PCT: 9 %
Monocytes Absolute: 0.8 10*3/uL (ref 0.2–0.9)
Neutro Abs: 5.5 10*3/uL (ref 1.4–6.5)
Neutrophils Relative %: 60 %
PLATELETS: 406 10*3/uL (ref 150–440)
RBC: 4.76 MIL/uL (ref 3.80–5.20)
RDW: 13.3 % (ref 11.5–14.5)
WBC: 9 10*3/uL (ref 3.6–11.0)

## 2018-03-04 ENCOUNTER — Encounter: Payer: BLUE CROSS/BLUE SHIELD | Admitting: Obstetrics and Gynecology

## 2018-03-05 ENCOUNTER — Encounter: Payer: BLUE CROSS/BLUE SHIELD | Admitting: Obstetrics and Gynecology

## 2018-03-20 ENCOUNTER — Ambulatory Visit (INDEPENDENT_AMBULATORY_CARE_PROVIDER_SITE_OTHER): Payer: BLUE CROSS/BLUE SHIELD | Admitting: Nurse Practitioner

## 2018-03-20 ENCOUNTER — Other Ambulatory Visit: Payer: Self-pay

## 2018-03-20 ENCOUNTER — Encounter: Payer: Self-pay | Admitting: Nurse Practitioner

## 2018-03-20 VITALS — BP 115/84 | HR 86 | Temp 98.6°F | Resp 16 | Ht 61.0 in | Wt 132.2 lb

## 2018-03-20 DIAGNOSIS — Z1231 Encounter for screening mammogram for malignant neoplasm of breast: Secondary | ICD-10-CM | POA: Diagnosis not present

## 2018-03-20 DIAGNOSIS — R42 Dizziness and giddiness: Secondary | ICD-10-CM | POA: Diagnosis not present

## 2018-03-20 DIAGNOSIS — Z0000 Encounter for general adult medical examination without abnormal findings: Secondary | ICD-10-CM

## 2018-03-20 DIAGNOSIS — Z1239 Encounter for other screening for malignant neoplasm of breast: Secondary | ICD-10-CM

## 2018-03-20 MED ORDER — MECLIZINE HCL 12.5 MG PO TABS: 6.2500 mg | ORAL_TABLET | Freq: Three times a day (TID) | ORAL | 0 refills | Status: DC | PRN

## 2018-03-20 NOTE — Patient Instructions (Addendum)
Tasha Simmons,   Thank you for coming in to clinic today.  1. Your mammogram order has been placed.  It is due in November around the 11th. Call the Scheduling phone number at [phone] to schedule your mammogram at your convenience.  You can choose to go to either location listed below.  Let the scheduler know which location you prefer.  [place]  [place], [place] [address]   [facility] [address] [place], [place] [address]   2. START meclizine 12.5 mg tablet.  Take 1/2 - 1 tablet up to three times daily for dizziness.  I believe you may have vertigo.   - START epley maneuvers as listed below. Also, mention these symptoms to your ENT doctor on your next visit for additional advice if symptoms are not improving.  Please schedule a follow-up appointment with Cassell Smiles, AGNP. Return in about 1 year (around 03/21/2019) for annual physical AND as needed for dizziness.  If you have any other questions or concerns, please feel free to call the clinic or send a message through [place]. You may also schedule an earlier appointment if necessary.  You will receive a survey after today's visit either digitally by e-mail or paper by [employer]. Your experiences and feedback matter to [place].  Please respond so we know how we are doing as we provide care for you.   Cassell Smiles, DNP, AGNP-BC Adult Gerontology Nurse Practitioner [facility], [facility]  How to Perform the Epley Maneuver The Epley maneuver is an exercise that relieves symptoms of vertigo. Vertigo is the feeling that you or your surroundings are moving when they are not. When you feel vertigo, you may feel like the room is spinning and have trouble walking. Dizziness is a little different than vertigo. When you are dizzy, you may feel unsteady or light-headed. You can do this maneuver at home whenever  you have symptoms of vertigo. You can do it up to 3 times a day until your symptoms go away. Even though the Epley maneuver may relieve your vertigo for a few weeks, it is possible that your symptoms will return. This maneuver relieves vertigo, but it does not relieve dizziness. What are the risks? If it is done correctly, the Epley maneuver is considered safe. Sometimes it can lead to dizziness or nausea that goes away after a short time. If you develop other symptoms, such as changes in vision, weakness, or numbness, stop doing the maneuver and call your health care provider. How to perform the Epley maneuver 1. Sit on the edge of a bed or table with your back straight and your legs extended or hanging over the edge of the bed or table. 2. Turn your head halfway toward the affected ear or side. 3. Lie backward quickly with your head turned until you are lying flat on your back. You may want to position a pillow under your shoulders. 4. Hold this position for 30 seconds. You may experience an attack of vertigo. This is normal. 5. Turn your head to the opposite direction until your unaffected ear is facing the floor. 6. Hold this position for 30 seconds. You may experience an attack of vertigo. This is normal. Hold this position until the vertigo stops. 7. Turn your whole body to the same side as your head. Hold for another 30 seconds. 8. Sit back up. You can repeat this exercise  up to 3 times a day. Follow these instructions at home:  After doing the Epley maneuver, you can return to your normal activities.  Ask your health care provider if there is anything you should do at home to prevent vertigo. He or she may recommend that you: ? Keep your head raised (elevated) with two or more pillows while you sleep. ? Do not sleep on the side of your affected ear. ? Get up slowly from bed. ? Avoid sudden movements during the day. ? Avoid extreme head movement, like looking up or bending  over. Contact a health care provider if:  Your vertigo gets worse.  You have other symptoms, including: ? Nausea. ? Vomiting. ? Headache. Get help right away if:  You have vision changes.  You have a severe or worsening headache or neck pain.  You cannot stop vomiting.  You have new numbness or weakness in any part of your body. Summary  Vertigo is the feeling that you or your surroundings are moving when they are not.  The Epley maneuver is an exercise that relieves symptoms of vertigo.  If the Epley maneuver is done correctly, it is considered safe. You can do it up to 3 times a day. This information is not intended to replace advice given to you by your health care provider. Make sure you discuss any questions you have with your health care provider. Document Released: 07/20/2013 Document Revised: 06/04/2016 Document Reviewed: 06/04/2016 Elsevier Interactive Patient Education  2017 [employer].

## 2018-03-20 NOTE — Progress Notes (Signed)
Subjective:    Patient ID: Tasha Simmons, female    DOB: 01/20/1971, 46 y.o.   MRN: [id number]  Tasha Simmons is a 47 y.o. female presenting on 03/20/2018 for Annual Exam   HPI Annual Physical Exam Patient has been feeling well except for acute concerns today about dizziness. Sleeps 5-6 hours per night uninterrupted.  HEALTH MAINTENANCE: Weight/BMI: maintained Physical activity: Regular Diet: generally healthy Seatbelt: always Sunscreen: never PAP: s/p hysterectomy Mammogram: yearly - due in November HIV: neg in past Optometry: yregular Dentistry: regular  VACCINES: Tetanus: 2106 Influenza: Recommended   Dizziness Patient presents with complaint of dizziness over last several weeks.  Patient has not experienced this in past. Ok with position changes / no increase.  Has change in sensation/dizziness when moving around occasionally.  Sitting down has helped symptoms some, but returned within the hour.  Is drinking lots of water.  Same sensation during diferent times.  Is happening more in open spaces.  Happened more during walk with weaving gait.  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  Also occurs more with loud noises at work.    Past Medical History:  Diagnosis Date  . Acid reflux   . Anemia   . Benign breast cyst in female    5 years ago.   Marland Kitchen Deafness in left ear   . Difficult intubation    Very limited ROM in neck, large overbite. Grade III view with MIL 2 and cricoid pressure. Otherwise atraumatic.  . Duodenitis   . Heart murmur   . Hyperlipidemia   . Thrombocytosis ([place])   . Uterine fibroid    Past Surgical History:  Procedure Laterality Date  . BACK SURGERY     scoliosis  . BREAST BIOPSY Left 7 years ago   Benign no scar  . COLONOSCOPY WITH PROPOFOL N/A 06/28/2016   Procedure: COLONOSCOPY WITH PROPOFOL;  Surgeon: Jonathon Bellows, MD;  Location: [facility] END[facility]COPY;  Service: Endoscopy;  Laterality: N/A;  . ESOPHAGOGASTRODUODENOSCOPY (EGD) WITH PROPOFOL N/A 06/28/2016   Procedure: ESOPHAGOGASTRODUODENOSCOPY (EGD) WITH PROPOFOL;  Surgeon: Jonathon Bellows, MD;  Location: [facility] ENDOSCOPY;  Service: Endoscopy;  Laterality: N/A;  . LAPAROSCOPIC ASSISTED VAGINAL HYSTERECTOMY N/A 10/27/2017   Procedure: LAPAROSCOPIC ASSISTED VAGINAL HYSTERECTOMY;  Surgeon: Harlin Heys, MD;  Location: [facility] ORS;  Service: Gynecology;  Laterality: N/A;   Social History   Socioeconomic History  . Marital status: Single    Spouse name: Not on file  . Number of children: Not on file  . Years of education: Not on file  . Highest education level: Not on file  Occupational History  . Not on file  Social Needs  . Financial resource strain: Not on file  . Food insecurity:    Worry: Not on file    Inability: Not on file  . Transportation needs:    Medical: Not on file    Non-medical: Not on file  Tobacco Use  . Smoking status: Current Every Day Smoker    Packs/day: 0.25    Years: 13.00    Pack years: 3.25  . Smokeless tobacco: Never Used  Substance and Sexual Activity  . Alcohol use: No  . Drug use: No  . Sexual activity: Yes    Birth control/protection: Surgical  Lifestyle  . Physical activity:    Days per week: Not on file    Minutes per session: Not on file  . Stress: Not on file  Relationships  . Social connections:    Talks on phone:  Not on file    Gets together: Not on file    Attends religious service: Not on file    Active member of club or organization: Not on file    Attends meetings of clubs or organizations: Not on file    Relationship status: Not on file  . Intimate partner violence:    Fear of current or ex partner: Not on file    Emotionally abused: Not on file    Physically abused: Not on file    Forced sexual activity: Not on file  Other Topics Concern  . Not on file  Social History Narrative  . Not on file   Family History  Problem Relation Age of Onset  . Hyperlipidemia Mother   . Hypertension Mother   . COPD Mother   . Heart disease  Father   . Hyperlipidemia Father   . Heart disease Maternal Grandmother   . Thyroid disease Sister   . Breast cancer Neg Hx    Current Outpatient Medications on File Prior to Visit  Medication Sig  . Cyanocobalamin (B-12) 1000 MCG CAPS Take by mouth.  . ferrous sulfate 325 (65 FE) MG tablet Take 325 mg by mouth daily.    No current facility-administered medications on file prior to visit.     Review of Systems  Constitutional: Negative for chills and fever.  HENT: Negative for congestion and sore throat.   Eyes: Negative for pain.  Respiratory: Negative for cough, shortness of breath and wheezing.   Cardiovascular: Negative for chest pain, palpitations and leg swelling.  Gastrointestinal: Negative for abdominal pain, blood in stool, constipation, diarrhea, nausea and vomiting.  Endocrine: Negative for polydipsia.  Genitourinary: Negative for dysuria, frequency, hematuria and urgency.  Musculoskeletal: Negative for back pain, myalgias and neck pain.  Skin: Negative.  Negative for rash.  Allergic/Immunologic: Negative for environmental allergies.  Neurological: Negative for dizziness, weakness and headaches.  Hematological: Does not bruise/bleed easily.  Psychiatric/Behavioral: Negative for dysphoric mood and suicidal ideas. The patient is not nervous/anxious.    Per HPI unless specifically indicated above      Objective:    BP 115/84 (BP Location: Right Arm, Patient Position: Sitting, Cuff Size: Normal)   Pulse 86   Temp 98.6 F (37 C) (Oral)   Resp 16   Ht 5\' 1"  (1.549 m)   Wt 132 lb 3.2 oz (60 kg)   LMP 10/20/2017 (Exact Date)   SpO2 100%   BMI 24.98 kg/m   Wt Readings from Last 3 Encounters:  03/20/18 132 lb 3.2 oz (60 kg)  12/02/17 135 lb 12.8 oz (61.6 kg)  11/04/17 131 lb 4.8 oz (59.6 kg)    Physical Exam  Constitutional: She is oriented to person, place, and time. She appears well-developed and well-nourished. No distress.  HENT:  Head: Normocephalic and  atraumatic.  Right Ear: External ear normal.  Left Ear: External ear normal.  Nose: Nose normal.  Mouth/Throat: Oropharynx is clear and moist.  Eyes: Pupils are equal, round, and reactive to light. Conjunctivae are normal.  Neck: Normal range of motion. Neck supple. No JVD present. No tracheal deviation present. No thyromegaly present.  Cardiovascular: Normal rate, regular rhythm, normal heart sounds and intact distal pulses. Exam reveals no gallop and no friction rub.  No murmur heard. Pulmonary/Chest: Effort normal and breath sounds normal. No respiratory distress.  Breast - Normal exam w/ symmetric breasts, no mass, no nipple discharge, no skin changes or tenderness.  Fibrocystic changes noted.  Abdominal: Soft.  Bowel sounds are normal. She exhibits no distension. There is no hepatosplenomegaly. There is no tenderness.  Musculoskeletal: Normal range of motion.  Lymphadenopathy:    She has no cervical adenopathy.  Neurological: She is alert and oriented to person, place, and time. No cranial nerve deficit.  Positive dizziness with side to side head turn.  Positive dix hallpike with leftward gaze.  Forward head tilt negative.  Skin: Skin is warm and dry. Capillary refill takes less than 2 seconds.  Psychiatric: She has a normal mood and affect. Her behavior is normal. Judgment and thought content normal.  Nursing note and vitals reviewed.  Results for orders placed or performed in visit on 12/12/17  CBC with Differential/Platelet  Result Value Ref Range   WBC 9.0 3.6 - 11.0 K/uL   RBC 4.76 3.80 - 5.20 MIL/uL   Hemoglobin 14.0 12.0 - 16.0 g/dL   HCT 41.0 35.0 - 47.0 %   MCV 86.2 80.0 - 100.0 fL   MCH 29.4 26.0 - 34.0 pg   MCHC 34.1 32.0 - 36.0 g/dL   RDW 13.3 11.5 - 14.5 %   Platelets 406 150 - 440 K/uL   Neutrophils Relative % 60 %   Neutro Abs 5.5 1.4 - 6.5 K/uL   Lymphocytes Relative 28 %   Lymphs Abs 2.5 1.0 - 3.6 K/uL   Monocytes Relative 9 %   Monocytes Absolute 0.8 0.2 -  0.9 K/uL   Eosinophils Relative 2 %   Eosinophils Absolute 0.1 0 - 0.7 K/uL   Basophils Relative 1 %   Basophils Absolute 0.1 0 - 0.1 K/uL      Assessment & Plan:   Problem List Items Addressed This Visit    None    Visit Diagnoses    Encounter for annual physical exam    -  Primary   Relevant Orders   CBC with Differential/Platelet   Hemoglobin A1c   Lipid panel   TSH   COMPLETE METABOLIC PANEL WITH GFR   MM DIGITAL SCREENING BILATERAL   Breast cancer screening       Relevant Orders   MM DIGITAL SCREENING BILATERAL   Vertigo       Relevant Medications   meclizine (ANTIVERT) 12.5 MG tablet    # Physical exam with new findings of vertigo in adult with acute concerns about dizziness.  Generally healthy lifestyle reported at annual physical.  Plan: 1. Obtain health maintenance screenings as above according to age. - Increase physical activity to 30 minutes most days of the week.  - Eat healthy diet high in vegetables and fruits; low in refined carbohydrates. 2. Return 1 year for annual physical.    # Vertigo Patient with acute to subacute dizziness most consistent with vertigo.  Cannot exclude other vestibular cause with known deafness in left ear. - START meclizine 12.5 mg tablet.  Take 1/2 - 1 tablet up to three times daily as needed for dizziness. - START epley maneuvers twice daily. - Discussed with patient to mention symptoms to ENT at next visit in a few weeks. - Followup prn if symptoms worsen or do not improve.  Meds ordered this encounter  Medications  . meclizine (ANTIVERT) 12.5 MG tablet    Sig: Take 0.5-1 tablets (6.25-12.5 mg total) by mouth 3 (three) times daily as needed for dizziness.    Dispense:  30 tablet    Refill:  0    Order Specific Question:   Supervising Provider    Answer:   Par[place] Ranger,  Devonne Doughty [[id number]]     Follow up plan: Return in about 1 year (around 03/21/2019) for annual physical AND as needed for dizziness.  Cassell Smiles, DNP,  AGPCNP-BC Adult Gerontology Primary Care Nurse Practitioner [facility] 03/20/2018, 9:46 AM

## 2018-03-21 LAB — COMPLETE METABOLIC PANEL WITH GFR
AG Ratio: 1.5 (calc) (ref 1.0–2.5)
ALT: 11 U/L (ref 6–29)
AST: 13 U/L (ref 10–35)
Albumin: 4.5 g/dL (ref 3.6–5.1)
Alkaline phosphatase (APISO): 45 U/L (ref 33–115)
BUN: 8 mg/dL (ref 7–25)
CO2: 26 mmol/L (ref 20–32)
Calcium: 9.5 mg/dL (ref 8.6–10.2)
Chloride: 103 mmol/L (ref 98–110)
Creat: 0.66 mg/dL (ref 0.50–1.10)
GFR, Est African American: 123 mL/min/{1.73_m2} (ref >=60)
GFR, Est Non African American: 106 mL/min/{1.73_m2} (ref >=60)
Globulin: 3 g/dL (calc) (ref 1.9–3.7)
Glucose, Bld: 80 mg/dL (ref 65–99)
Potassium: 4.2 mmol/L (ref 3.5–5.3)
Sodium: 138 mmol/L (ref 135–146)
Total Bilirubin: 0.5 mg/dL (ref 0.2–1.2)
Total Protein: 7.5 g/dL (ref 6.1–8.1)

## 2018-03-21 LAB — LIPID PANEL
Cholesterol: 234 mg/dL — ABNORMAL HIGH (ref <200)
HDL: 64 mg/dL (ref >50)
LDL Cholesterol (Calc): 151 mg/dL (calc) — ABNORMAL HIGH
Non-HDL Cholesterol (Calc): 170 mg/dL (calc) — ABNORMAL HIGH (ref <130)
Total CHOL/HDL Ratio: 3.7 (calc) (ref <5.0)
Triglycerides: 89 mg/dL (ref <150)

## 2018-03-21 LAB — CBC WITH DIFFERENTIAL/PLATELET
Basophils Absolute: 56 cells/uL (ref 0–200)
Basophils Relative: 0.6 %
Eosinophils Absolute: 94 cells/uL (ref 15–500)
Eosinophils Relative: 1 %
HCT: 48 % — ABNORMAL HIGH (ref 35.0–45.0)
Hemoglobin: 15.8 g/dL — ABNORMAL HIGH (ref 11.7–15.5)
Lymphs Abs: 2688 cells/uL (ref 850–3900)
MCH: 29.9 pg (ref 27.0–33.0)
MCHC: 32.9 g/dL (ref 32.0–36.0)
MCV: 90.9 fL (ref 80.0–100.0)
MPV: 9 fL (ref 7.5–12.5)
Monocytes Relative: 7.1 %
Neutro Abs: 5894 cells/uL (ref 1500–7800)
Neutrophils Relative %: 62.7 %
Platelets: 417 10*3/uL — ABNORMAL HIGH (ref 140–400)
RBC: 5.28 10*6/uL — ABNORMAL HIGH (ref 3.80–5.10)
RDW: 13.6 % (ref 11.0–15.0)
Total Lymphocyte: 28.6 %
WBC mixed population: 667 cells/uL (ref 200–950)
WBC: 9.4 10*3/uL (ref 3.8–10.8)

## 2018-03-21 LAB — HEMOGLOBIN A1C
Hgb A1c MFr Bld: 5 % of total Hgb (ref <5.7)
Mean Plasma Glucose: 97 (calc)
eAG (mmol/L): 5.4 (calc)

## 2018-03-21 LAB — TSH: TSH: 0.87 mIU/L

## 2018-03-22 ENCOUNTER — Encounter: Payer: Self-pay | Admitting: Nurse Practitioner

## 2018-03-23 ENCOUNTER — Encounter: Payer: BLUE CROSS/BLUE SHIELD | Admitting: Nurse Practitioner

## 2018-03-23 ENCOUNTER — Other Ambulatory Visit: Payer: Self-pay | Admitting: Nurse Practitioner

## 2018-04-03 DIAGNOSIS — J358 Other chronic diseases of tonsils and adenoids: Secondary | ICD-10-CM | POA: Diagnosis not present

## 2018-04-03 DIAGNOSIS — J039 Acute tonsillitis, unspecified: Secondary | ICD-10-CM | POA: Diagnosis not present

## 2018-04-30 ENCOUNTER — Telehealth: Payer: Self-pay | Admitting: Nurse Practitioner

## 2018-04-30 DIAGNOSIS — N6002 Solitary cyst of left breast: Secondary | ICD-10-CM

## 2018-04-30 NOTE — Telephone Encounter (Signed)
Pt's mammo order should be for diagnostic.

## 2018-05-01 ENCOUNTER — Ambulatory Visit (INDEPENDENT_AMBULATORY_CARE_PROVIDER_SITE_OTHER): Payer: BLUE CROSS/BLUE SHIELD

## 2018-05-01 DIAGNOSIS — Z23 Encounter for immunization: Secondary | ICD-10-CM

## 2018-05-01 NOTE — Telephone Encounter (Signed)
Orders placed.

## 2018-05-22 ENCOUNTER — Emergency Department: Payer: BLUE CROSS/BLUE SHIELD

## 2018-05-22 ENCOUNTER — Emergency Department
Admission: EM | Admit: 2018-05-22 | Discharge: 2018-05-22 | Disposition: A | Discharge status: Home / Self Care | Payer: BLUE CROSS/BLUE SHIELD | Attending: Emergency Medicine | Admitting: Emergency Medicine

## 2018-05-22 DIAGNOSIS — R002 Palpitations: Secondary | ICD-10-CM | POA: Diagnosis not present

## 2018-05-22 DIAGNOSIS — F17200 Nicotine dependence, unspecified, uncomplicated: Secondary | ICD-10-CM | POA: Diagnosis not present

## 2018-05-22 DIAGNOSIS — F17210 Nicotine dependence, cigarettes, uncomplicated: Secondary | ICD-10-CM | POA: Diagnosis not present

## 2018-05-22 LAB — CBC
HCT: 46.6 % — ABNORMAL HIGH (ref 36.0–46.0)
Hemoglobin: 15.4 g/dL — ABNORMAL HIGH (ref 12.0–15.0)
MCH: 30.3 pg (ref 26.0–34.0)
MCHC: 33 g/dL (ref 30.0–36.0)
MCV: 91.6 fL (ref 80.0–100.0)
Platelets: 392 10*3/uL (ref 150–400)
RBC: 5.09 MIL/uL (ref 3.87–5.11)
RDW: 13.7 % (ref 11.5–15.5)
WBC: 10 10*3/uL (ref 4.0–10.5)
nRBC: 0 % (ref 0.0–0.2)

## 2018-05-22 LAB — BASIC METABOLIC PANEL
Anion gap: 9 (ref 5–15)
BUN: 12 mg/dL (ref 6–20)
CO2: 25 mmol/L (ref 22–32)
Calcium: 8.8 mg/dL — ABNORMAL LOW (ref 8.9–10.3)
Chloride: 105 mmol/L (ref 98–111)
Creatinine, Ser: 0.64 mg/dL (ref 0.44–1.00)
GFR calc Af Amer: >60 mL/min (ref >60)
GFR calc non Af Amer: >60 mL/min (ref >60)
GLUCOSE: 105 mg/dL — AB (ref 70–99)
POTASSIUM: 3.9 mmol/L (ref 3.5–5.1)
Sodium: 139 mmol/L (ref 135–145)

## 2018-05-22 LAB — TROPONIN I: Troponin I: <0.03 ng/mL (ref <0.03)

## 2018-05-22 NOTE — ED Provider Notes (Signed)
[facility] Emergency Department Provider Note   ____________________________________________    I have reviewed the triage vital signs and the nursing notes.   HISTORY  Chief Complaint Palpitations     HPI Tasha Simmons is a 47 y.o. female presents with reports of palpitations.  Patient reports that approximately 430 to 5 AM she had about 10 minutes of palpitations at which time her chest felt "hot".  No chest pressure, no shortness of breath.  No pleurisy.  No recent travel.  No calf pain or swelling.  She reports this occurs approximately once a month for the last year.  Denies a history of arrhythmias.  Denies thyroid disease.   Past Medical History:  Diagnosis Date  . Acid reflux   . Anemia   . Benign breast cyst in female    5 years ago.   Marland Kitchen Deafness in left ear   . Difficult intubation    Very limited ROM in neck, large overbite. Grade III view with MIL 2 and cricoid pressure. Otherwise atraumatic.  . Duodenitis   . Heart murmur   . Hyperlipidemia   . Thrombocytosis ([place])   . Uterine fibroid     Patient Active Problem List   Diagnosis Date Noted  . Post-operative state 10/27/2017  . Thrombocytosis ([place]) 06/17/2017  . Uterine fibroid 04/04/2017  . 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 Heart murmur, systolic 78/46/9629  . Hyperlipidemia 09/09/2016  . Duodenitis   . Polyp of duodenum   . 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 Iron deficiency anemia 02/12/2016  . Benign cyst of left breast 07/07/2015  . Gastroesophageal reflux disease without esophagitis 07/07/2015    Past Surgical History:  Procedure Laterality Date  . BACK SURGERY     scoliosis  . BREAST BIOPSY Left 7 years ago   Benign no scar  . COLONOSCOPY WITH PROPOFOL N/A 06/28/2016   Procedure: COLONOSCOPY WITH PROPOFOL;  Surgeon: Jonathon Bellows, MD;  Location: [facility] ENDOSCOPY;  Service: Endoscopy;  Laterality: N/A;  .  ESOPHAGOGASTRODUODENOSCOPY (EGD) WITH PROPOFOL N/A 06/28/2016   Procedure: ESOPHAGOGASTRODUODENOSCOPY (EGD) WITH PROPOFOL;  Surgeon: Jonathon Bellows, MD;  Location: [facility] ENDOSCOPY;  Service: Endoscopy;  Laterality: N/A;  . LAPAROSCOPIC ASSISTED VAGINAL HYSTERECTOMY N/A 10/27/2017   Procedure: LAPAROSCOPIC ASSISTED VAGINAL HYSTERECTOMY;  Surgeon: Harlin Heys, MD;  Location: [facility] ORS;  Service: Gynecology;  Laterality: N/A;    Prior to Admission medications   Medication Sig Start Date End Date Taking? Authorizing Provider  meclizine (ANTIVERT) 12.5 MG tablet Take 0.5-1 tablets (6.25-12.5 mg total) by mouth 3 (three) times daily as needed for dizziness. 03/20/18   Mikey College, NP     Allergies Patient has no known allergies.  Family History  Problem Relation Age of Onset  . Hyperlipidemia Mother   . Hypertension Mother   . COPD Mother   . Heart disease Father   . Hyperlipidemia Father   . Heart disease Maternal Grandmother   . Thyroid disease Sister   . Breast cancer Neg Hx     Social History Social History   Tobacco Use  . Smoking status: Current Every Day Smoker    Packs/day: 0.25    Years: 13.00    Pack years: 3.25  . Smokeless tobacco: Never Used  Substance Use Topics  . Alcohol use: No  . Drug use: No    Review of Systems  Constitutional: No fever/chills Eyes: No visual changes.  ENT: No neck pain  or swelling Cardiovascular: As above Respiratory: Denies shortness of breath. Gastrointestinal: No abdominal pain.   Genitourinary: Negative for dysuria. Musculoskeletal: Negative for back pain. Skin: Negative for rash. Neurological: Negative for headaches   ____________________________________________   PHYSICAL EXAM:  VITAL SIGNS: ED Triage Vitals  Enc Vitals Group     BP 05/22/18 0640 (!) 146/95     Pulse Rate 05/22/18 0640 90     Resp 05/22/18 0640 18     Temp 05/22/18 0640 98 F (36.7 C)     Temp src --      SpO2 05/22/18 0640 100 %      Weight 05/22/18 0641 59 kg (130 lb)     Height 05/22/18 0641 1.626 m (5\' 4" )     Head Circumference --      Peak Flow --      Pain Score 05/22/18 0641 4     Pain Loc --      Pain Edu? --      Excl. in [place]? --     Constitutional: Alert and oriented. No acute distress. Pleasant and interactive  Nose: No congestion/rhinnorhea. Mouth/Throat: Mucous membranes are moist.  Thyroid feels normal in size  Cardiovascular: Normal rate, regular rhythm. Grossly normal heart sounds.  Good peripheral circulation. Respiratory: Normal respiratory effort.  No retractions. Lungs CTAB. Gastrointestinal: Soft and nontender. No distention.    Musculoskeletal: No lower extremity tenderness nor edema.  Warm and well perfused Neurologic:  Normal speech and language. No gross focal neurologic deficits are appreciated.  Skin:  Skin is warm, dry and intact. No rash noted. Psychiatric: Mood and affect are normal. Speech and behavior are normal.  ____________________________________________   LABS (all labs ordered are listed, but only abnormal results are displayed)  Labs Reviewed  BASIC METABOLIC PANEL - Abnormal; Notable for the following components:      Result Value   Glucose, Bld 105 (*)    Calcium 8.8 (*)    All other components within normal limits  CBC - Abnormal; Notable for the following components:   Hemoglobin 15.4 (*)    HCT 46.6 (*)    All other components within normal limits  TROPONIN I   ____________________________________________  EKG  ED ECG REPORT I, Lavonia Drafts, the attending physician, personally viewed and interpreted this ECG.  Date: 05/22/2018  Rhythm: normal sinus rhythm QRS Axis: normal Intervals: normal ST/T Wave abnormalities: normal Narrative Interpretation: no evidence of acute ischemia  ____________________________________________  RADIOLOGY  Chest x-ray unremarkable ____________________________________________   PROCEDURES  Procedure(s)  performed: No  Procedures   Critical Care performed: No ____________________________________________   INITIAL IMPRESSION / ASSESSMENT AND PLAN / ED COURSE  Pertinent labs & imaging results that were available during my care of the patient were reviewed by me and considered in my medical decision making (see chart for details).  Patient presents with brief period of palpitations this morning.  EKG is overall quite reassuring, no delta wave or evidence of arrhythmias.  She may be having SVT which is self-correcting, lab work is unremarkable.  Vitals are reassuring.  She feels well and has no complaints.  I will refer to cardiology for likely Holter monitoring    ____________________________________________   FINAL CLINICAL IMPRESSION(S) / ED DIAGNOSES  Final diagnoses:  Palpitations        Note:  This document was prepared using Dragon voice recognition software and may include unintentional dictation errors.    Lavonia Drafts, MD 05/22/18 [phone]

## 2018-05-22 NOTE — ED Notes (Signed)
Pt c/o of palpations described as fast heart rate around 5am this morning. Pt states this has happened before. Denies SOB, nausea, or chest pain with palpations. Just described her chest as feeling hot "liek heart burn" at time of occurrence. Pt denies any discomfort now, states she feels okay now just wanted to be checked out.

## 2018-05-22 NOTE — ED Triage Notes (Signed)
Patient c/o medial chest discomfort described as burning. Patient denies radiation.  Patient c/o palpitations that have since resolved. Patient reports light-headedness and hot flash with onset of palpitations (all resolved)

## 2018-06-02 DIAGNOSIS — E041 Nontoxic single thyroid nodule: Secondary | ICD-10-CM | POA: Diagnosis not present

## 2018-06-02 DIAGNOSIS — H9042 Sensorineural hearing loss, unilateral, left ear, with unrestricted hearing on the contralateral side: Secondary | ICD-10-CM | POA: Diagnosis not present

## 2018-06-02 DIAGNOSIS — J340 Abscess, furuncle and carbuncle of nose: Secondary | ICD-10-CM | POA: Diagnosis not present

## 2018-06-09 ENCOUNTER — Ambulatory Visit
Admission: RE | Admit: 2018-06-09 | Discharge: 2018-06-09 | Disposition: A | Discharge status: Home / Self Care | Payer: BLUE CROSS/BLUE SHIELD | Source: Ambulatory Visit | Attending: Nurse Practitioner | Admitting: Nurse Practitioner

## 2018-06-09 DIAGNOSIS — N6002 Solitary cyst of left breast: Secondary | ICD-10-CM | POA: Diagnosis not present

## 2018-06-09 DIAGNOSIS — N6322 Unspecified lump in the left breast, upper inner quadrant: Secondary | ICD-10-CM | POA: Diagnosis not present

## 2018-06-09 DIAGNOSIS — R922 Inconclusive mammogram: Secondary | ICD-10-CM | POA: Diagnosis not present

## 2018-08-07 ENCOUNTER — Ambulatory Visit: Payer: BLUE CROSS/BLUE SHIELD | Admitting: Nurse Practitioner

## 2018-08-07 ENCOUNTER — Encounter: Payer: Self-pay | Admitting: Nurse Practitioner

## 2018-08-07 ENCOUNTER — Other Ambulatory Visit: Payer: Self-pay

## 2018-08-07 VITALS — BP 122/77 | HR 97 | Temp 98.6°F | Ht 64.0 in | Wt 134.6 lb

## 2018-08-07 DIAGNOSIS — R300 Dysuria: Secondary | ICD-10-CM

## 2018-08-07 DIAGNOSIS — N3001 Acute cystitis with hematuria: Secondary | ICD-10-CM

## 2018-08-07 LAB — POCT URINALYSIS DIPSTICK
Bilirubin, UA: NEGATIVE
Glucose, UA: NEGATIVE
Ketones, UA: NEGATIVE
Nitrite, UA: POSITIVE
Protein, UA: NEGATIVE
Spec Grav, UA: 1.01 (ref 1.010–1.025)
Urobilinogen, UA: 0.2 E.U./dL
pH, UA: 5 (ref 5.0–8.0)

## 2018-08-07 MED ORDER — CEPHALEXIN 500 MG PO CAPS
500.0000 mg | ORAL_CAPSULE | Freq: Three times a day (TID) | ORAL | 0 refills | Status: AC
Start: 2018-08-07 — End: 2018-08-12

## 2018-08-07 NOTE — Progress Notes (Signed)
Subjective:    Patient ID: Tasha Simmons, female    DOB: 03-Nov-1970, 48 y.o.   MRN: [id number]  Tasha Simmons is a 48 y.o. female presenting on 08/07/2018 for Dysuria (hematuria, frequent urination, and urgency x 2 days )  HPI UTI symptoms Started having symptoms about 24 hours ago.  Had dark color to urine for 2 days.  Noticed blood last night.  Increased urinary frequency, urgency, dysuria. - No pelvic pressure, pain in pelvis/back/flank.  Denies fever, chills, or sweats.  Social History   Tobacco Use  . Smoking status: Current Every Day Smoker    Packs/day: 0.25    Years: 13.00    Pack years: 3.25  . Smokeless tobacco: Never Used  Substance Use Topics  . Alcohol use: No  . Drug use: No   Review of Systems Per HPI unless specifically indicated above     Objective:    BP 122/77 (BP Location: Right Arm, Patient Position: Sitting, Cuff Size: Normal)   Pulse 97   Temp 98.6 F (37 C) (Oral)   Ht 5\' 4"  (1.626 m)   Wt 134 lb 9.6 oz (61.1 kg)   LMP 10/20/2017 (Exact Date)   BMI 23.10 kg/m   Wt Readings from Last 3 Encounters:  08/07/18 134 lb 9.6 oz (61.1 kg)  05/22/18 130 lb (59 kg)  03/20/18 132 lb 3.2 oz (60 kg)    Physical Exam Vitals signs reviewed.  Constitutional:      General: She is not in acute distress.    Appearance: She is well-developed.  HENT:     Head: Normocephalic and atraumatic.  Cardiovascular:     Rate and Rhythm: Normal rate and regular rhythm.     Pulses:          Radial pulses are 2+ on the right side and 2+ on the left side.       Posterior tibial pulses are 1+ on the right side and 1+ on the left side.     Heart sounds: Normal heart sounds, S1 normal and S2 normal.  Pulmonary:     Effort: Pulmonary effort is normal. No respiratory distress.     Breath sounds: Normal breath sounds and air entry.  Abdominal:     General: Bowel sounds are normal. There is no distension.     Palpations: Abdomen is soft.     Tenderness: There is no  abdominal tenderness.     Hernia: No hernia is present.  Musculoskeletal:     Right lower leg: No edema.     Left lower leg: No edema.  Skin:    General: Skin is warm and dry.     Capillary Refill: Capillary refill takes less than 2 seconds.  Neurological:     Mental Status: She is alert and oriented to person, place, and time.  Psychiatric:        Attention and Perception: Attention normal.        Mood and Affect: Mood and affect normal.        Behavior: Behavior normal. Behavior is cooperative.    Results for orders placed or performed in visit on 08/07/18  Urine Culture  Result Value Ref Range   MICRO NUMBER: [id number]    SPECIMEN QUALITY: Adequate    Sample Source NOT GIVEN    STATUS: FINAL    ISOLATE 1: Escherichia coli (A)       Susceptibility   Escherichia coli - URINE CULTURE, REFLEX    AMOX/CLAVULANIC 4  Sensitive     AMPICILLIN 4 Sensitive     AMPICILLIN/SULBACTAM <=2 Sensitive     CEFAZOLIN* <=4 Not Reportable      * For infections other than uncomplicated UTIcaused by E. coli, K. pneumoniae or P. mirabilis:Cefazolin is resistant if MIC > or = 8 mcg/mL.(Distinguishing susceptible versus intermediatefor isolates with MIC < or = 4 mcg/mL requiresadditional testing.)For uncomplicated UTI caused by E. coli,K. pneumoniae or P. mirabilis: Cefazolin issusceptible if MIC <32 mcg/mL and predictssusceptible to the oral agents cefaclor, cefdinir,cefpodoxime, cefprozil, cefuroxime, cephalexinand loracarbef.    CEFEPIME <=1 Sensitive     CEFTRIAXONE <=1 Sensitive     CIPROFLOXACIN <=0.25 Sensitive     LEVOFLOXACIN <=0.12 Sensitive     ERTAPENEM <=0.5 Sensitive     GENTAMICIN <=1 Sensitive     IMIPENEM <=0.25 Sensitive     NITROFURANTOIN <=16 Sensitive     PIP/TAZO <=4 Sensitive     TOBRAMYCIN <=1 Sensitive     TRIMETH/SULFA* <=20 Sensitive      * For infections other than uncomplicated UTIcaused by E. coli, K. pneumoniae or P. mirabilis:Cefazolin is resistant if MIC > or = 8  mcg/mL.(Distinguishing susceptible versus intermediatefor isolates with MIC < or = 4 mcg/mL requiresadditional testing.)For uncomplicated UTI caused by E. coli,K. pneumoniae or P. mirabilis: Cefazolin issusceptible if MIC <32 mcg/mL and predictssusceptible to the oral agents cefaclor, cefdinir,cefpodoxime, cefprozil, cefuroxime, cephalexinand loracarbef.Legend:S = Susceptible  I = IntermediateR = Resistant  NS = Not susceptible* = Not tested  NR = Not reported**NN = See antimicrobic comments  POCT Urinalysis Dipstick  Result Value Ref Range   Color, UA dark    Clarity, UA clear    Glucose, UA Negative Negative   Bilirubin, UA Negative    Ketones, UA Negative    Spec Grav, UA 1.010 1.010 - 1.025   Blood, UA large    pH, UA 5.0 5.0 - 8.0   Protein, UA Negative Negative   Urobilinogen, UA 0.2 0.2 or 1.0 E.U./dL   Nitrite, UA positive    Leukocytes, UA Large (3+) (A) Negative   Appearance     Odor        Assessment & Plan:   Problem List Items Addressed This Visit    None    Visit Diagnoses    Dysuria    -  Primary   Relevant Medications   cephALEXin (KEFLEX) 500 MG capsule   Other Relevant Orders   POCT Urinalysis Dipstick (Completed)   Acute cystitis with hematuria       Relevant Medications   cephALEXin (KEFLEX) 500 MG capsule   Other Relevant Orders   Urine Culture (Completed)    Acute cystitis with hematuria.  Pt symptomatic currently with increased suprapubic pressure x 2 days. Currently without systemic signs or symptoms of infection.   - No current risk of concurrent STI.  Plan: 1. START Keflex 500mg  3 times daily for next 5 days.   - Send Urine culture  2. Provided non-pharm measures for UTI prevention for good hygiene. 3. Drink plenty of fluids and improve hydration over next 1 week. 4. Provided precautions for severe symptoms requiring ED visit to include no urine in 24-48 hours. 5. Followup 2-5 days as needed for worsening or persistent symptoms.  If hematuria  persists, will need uroloyg referral.    Meds ordered this encounter  Medications  . cephALEXin (KEFLEX) 500 MG capsule    Sig: Take 1 capsule (500 mg total) by mouth 3 (three) times daily for  5 days.    Dispense:  15 capsule    Refill:  0    Order Specific Question:   Supervising Provider    Answer:   Olin Hauser [[id number]]    Follow up plan: Return in 5-7 days if symptoms worsen or fail to improve.  Cassell Smiles, DNP, AGPCNP-BC Adult Gerontology Primary Care Nurse Practitioner [facility] 08/07/2018, 2:00 PM

## 2018-08-07 NOTE — Patient Instructions (Addendum)
Tasha Simmons,   Thank you for coming in to clinic today.  1. You have a urinary tract infection -  START Keflex 500mg  3 times daily for next 5 days.   - May also take AZO or urinary pain relief over the counter for up to 2 days now and 2 days after antibiotic if needed for urinary discomfort.  - Void after intercourse. - Keep clean and dry (workouts, summer) - After hysterectomy, keep pelvic floor muscles well toned to support proper bladder position.  Please schedule a follow-up appointment with Cassell Smiles, AGNP. Return in 5-7 days if symptoms worsen or fail to improve.  If you have any other questions or concerns, please feel free to call the clinic or send a message through [facility]. You may also schedule an earlier appointment if necessary.  You will receive a survey after today's visit either digitally by e-mail or paper by [employer]. Your experiences and feedback matter to [place].  Please respond so we know how we are doing as we provide care for you.   Cassell Smiles, DNP, AGNP-BC Adult Gerontology Nurse Practitioner [facility], [facility]   Urinary Tract Infection, Adult  A urinary tract infection (UTI) is an infection of any part of the urinary tract. The urinary tract includes the kidneys, ureters, bladder, and urethra. These organs make, store, and get rid of urine in the body. Your health care provider may use other names to describe the infection. An upper UTI affects the ureters and kidneys (pyelonephritis). A lower UTI affects the bladder (cystitis) and urethra (urethritis). What are the causes? Most urinary tract infections are caused by bacteria in your genital area, around the entrance to your urinary tract (urethra). These bacteria grow and cause inflammation of your urinary tract. What increases the risk? You are more likely to develop this condition if:  You have a urinary catheter that stays in place (indwelling).  You are not able to control when  you urinate or have a bowel movement (you have incontinence).  You are female and you: ? Use a spermicide or diaphragm for birth control. ? Have low estrogen levels. ? Are pregnant.  You have certain genes that increase your risk (genetics).  You are sexually active.  You take antibiotic medicines.  You have a condition that causes your flow of urine to slow down, such as: ? An enlarged prostate, if you are female. ? Blockage in your urethra (stricture). ? A kidney stone. ? A nerve condition that affects your bladder control (neurogenic bladder). ? Not getting enough to drink, or not urinating often.  You have certain medical conditions, such as: ? Diabetes. ? A weak disease-fighting system (immunesystem). ? Sickle cell disease. ? Gout. ? Spinal cord injury. What are the signs or symptoms? Symptoms of this condition include:  Needing to urinate right away (urgently).  Frequent urination or passing small amounts of urine frequently.  Pain or burning with urination.  Blood in the urine.  Urine that smells bad or unusual.  Trouble urinating.  Cloudy urine.  Vaginal discharge, if you are female.  Pain in the abdomen or the lower back. You may also have:  Vomiting or a decreased appetite.  Confusion.  Irritability or tiredness.  A fever.  Diarrhea. The first symptom in older adults may be confusion. In some cases, they may not have any symptoms until the infection has worsened. How is this diagnosed? This condition is diagnosed based on your medical history and a  physical exam. You may also have other tests, including:  Urine tests.  Blood tests.  Tests for sexually transmitted infections (STIs). If you have had more than one UTI, a cystoscopy or imaging studies may be done to determine the cause of the infections. How is this treated? Treatment for this condition includes:  Antibiotic medicine.  Over-the-counter medicines to treat  discomfort.  Drinking enough water to stay hydrated. If you have frequent infections or have other conditions such as a kidney stone, you may need to see a health care provider who specializes in the urinary tract (urologist). In rare cases, urinary tract infections can cause sepsis. Sepsis is a life-threatening condition that occurs when the body responds to an infection. Sepsis is treated in the hospital with IV antibiotics, fluids, and other medicines. Follow these instructions at home:  Medicines  Take over-the-counter and prescription medicines only as told by your health care provider.  If you were prescribed an antibiotic medicine, take it as told by your health care provider. Do not stop using the antibiotic even if you start to feel better. General instructions  Make sure you: ? Empty your bladder often and completely. Do not hold urine for long periods of time. ? Empty your bladder after sex. ? Wipe from front to back after a bowel movement if you are female. Use each tissue one time when you wipe.  Drink enough fluid to keep your urine pale yellow.  Keep all follow-up visits as told by your health care provider. This is important. Contact a health care provider if:  Your symptoms do not get better after 1-2 days.  Your symptoms go away and then return. Get help right away if you have:  Severe pain in your back or your lower abdomen.  A fever.  Nausea or vomiting. Summary  A urinary tract infection (UTI) is an infection of any part of the urinary tract, which includes the kidneys, ureters, bladder, and urethra.  Most urinary tract infections are caused by bacteria in your genital area, around the entrance to your urinary tract (urethra).  Treatment for this condition often includes antibiotic medicines.  If you were prescribed an antibiotic medicine, take it as told by your health care provider. Do not stop using the antibiotic even if you start to feel  better.  Keep all follow-up visits as told by your health care provider. This is important. This information is not intended to replace advice given to you by your health care provider. Make sure you discuss any questions you have with your health care provider. Document Released: 04/24/2005 Document Revised: 01/22/2018 Document Reviewed: 01/22/2018 Elsevier Interactive Patient Education  2019 [employer].  Kegel Exercises Kegel exercises help strengthen the muscles that support the rectum, vagina, small intestine, bladder, and uterus. Doing Kegel exercises can help:  Improve bladder and bowel control.  Improve sexual response.  Reduce problems and discomfort during pregnancy. Kegel exercises involve squeezing your pelvic floor muscles, which are the same muscles you squeeze when you try to stop the flow of urine. The exercises can be done while sitting, standing, or lying down, but it is best to vary your position. Exercises 1. Squeeze your pelvic floor muscles tight. You should feel a tight lift in your rectal area. If you are a female, you should also feel a tightness in your vaginal area. Keep your stomach, buttocks, and legs relaxed. 2. Hold the muscles tight for up to 10 seconds. 3. Relax your muscles. Repeat this exercise 50  times a day or as many times as told by your health care provider. Continue to do this exercise for at least 4-6 weeks or for as long as told by your health care provider. This information is not intended to replace advice given to you by your health care provider. Make sure you discuss any questions you have with your health care provider. Document Released: 07/01/2012 Document Revised: 11/25/2016 Document Reviewed: 06/04/2015 Elsevier Interactive Patient Education  2019 [employer].

## 2018-08-10 ENCOUNTER — Encounter: Payer: Self-pay | Admitting: Nurse Practitioner

## 2018-08-10 LAB — URINE CULTURE
MICRO NUMBER:: 39746
SPECIMEN QUALITY:: ADEQUATE

## 2018-09-17 ENCOUNTER — Other Ambulatory Visit: Payer: Self-pay | Admitting: Nurse Practitioner

## 2018-09-17 DIAGNOSIS — K219 Gastro-esophageal reflux disease without esophagitis: Secondary | ICD-10-CM

## 2018-10-15 ENCOUNTER — Telehealth: Payer: Self-pay

## 2018-10-15 NOTE — Telephone Encounter (Signed)
The pt called with concerns because she was sent home from work today after she told them she was possible exposed to COVID-19. The pt finance work at [employer] and she states he received a email stating that he was exposed to someone with COVID 19. Now the patient is concern and requesting testing. The patient is asymptomatic, but states her finance Randall Hiss have developed a cough over the last 2 days. She states he have no other symptoms. She states he doesn't have a fever or SOB. I recommended that the patient and the husband quarantine themselves. I also informed her that I will contact her back after I spoke with the provider   I also recommended that the patient finance contact our office because he possible needs to be tested. She verbalize understanding.

## 2018-10-15 NOTE — Telephone Encounter (Signed)
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  Tasha Simmons is currently asymptomatic, but did have symptoms of a cold 4-5 days ago that are resolving.  This is not consistent with Covid-19 symptoms for testing at this time.  I cannot provide letter to [name] that she will not get Covid-19, but to reassure her that her [name] is currently asymptomatic and she is also asymptomatic so infection is unlikely.   - Recommend after additional triage of [name]'s symptoms that both [name] and Tasha Simmons continue to self-monitor and self-quarantine for 14 days if symptoms occur.   - Reinforced that patient should call if symptoms develop for testing.

## 2018-10-15 NOTE — Telephone Encounter (Signed)
At this point, Tasha Simmons is a contact of a contact of Covid-19.  She does not have positive close contact with someone diagnosed with Covid-19.  It is very important that her Tasha Simmons be tested.  Tasha Simmons is a patient of this clinic and we can arrange testing for him if needed and if he qualifies.  Tasha Simmons and Tasha Simmons should self-quarantine for the next 14 days.  Further instructions provided via MyChart.   Tasha Simmons, Please call Tasha Simmons to triage his symptoms and determine need for testing. **  While Tasha Simmons is still asymptomatic: 1. Use a separate room and bathroom for sick household members (if possible). 2. Clean hands regularly by handwashing with soap and water or using an alcohol-based hand sanitizer with at least 60% alcohol. Provide your sick household member with clean disposable facemasks to wear at home, if available, to help prevent spreading COVID-19 to others.  If not available, keep room doors closed for rooms that the sick household member is using. 3. Clean the sick room and bathroom, as needed, to avoid unnecessary contact with the sick person.  4. Avoid sharing personal items like utensils, food, and drinks.

## 2018-11-02 ENCOUNTER — Telehealth: Payer: BLUE CROSS/BLUE SHIELD | Admitting: Family

## 2018-11-02 DIAGNOSIS — A499 Bacterial infection, unspecified: Secondary | ICD-10-CM | POA: Diagnosis not present

## 2018-11-02 DIAGNOSIS — N390 Urinary tract infection, site not specified: Secondary | ICD-10-CM | POA: Diagnosis not present

## 2018-11-02 MED ORDER — NITROFURANTOIN MONOHYD MACRO 100 MG PO CAPS: 100.0000 mg | ORAL_CAPSULE | Freq: Two times a day (BID) | ORAL | 0 refills | Status: DC

## 2018-11-02 NOTE — Progress Notes (Signed)

## 2019-01-22 DIAGNOSIS — Z20828 Contact with and (suspected) exposure to other viral communicable diseases: Secondary | ICD-10-CM | POA: Diagnosis not present

## 2019-01-25 IMAGING — MG DIGITAL DIAGNOSTIC BILATERAL MAMMOGRAM WITH TOMO AND CAD
8 series · 8 of 24 positions shown · non-contrast
Comparison: Previous exam(s).

CLINICAL DATA: Follow-up left breast probable fibroadenoma.

EXAM:
DIGITAL DIAGNOSTIC BILATERAL MAMMOGRAM WITH CAD AND TOMO
ULTRASOUND LEFT BREAST

[L MLO synth-2D]
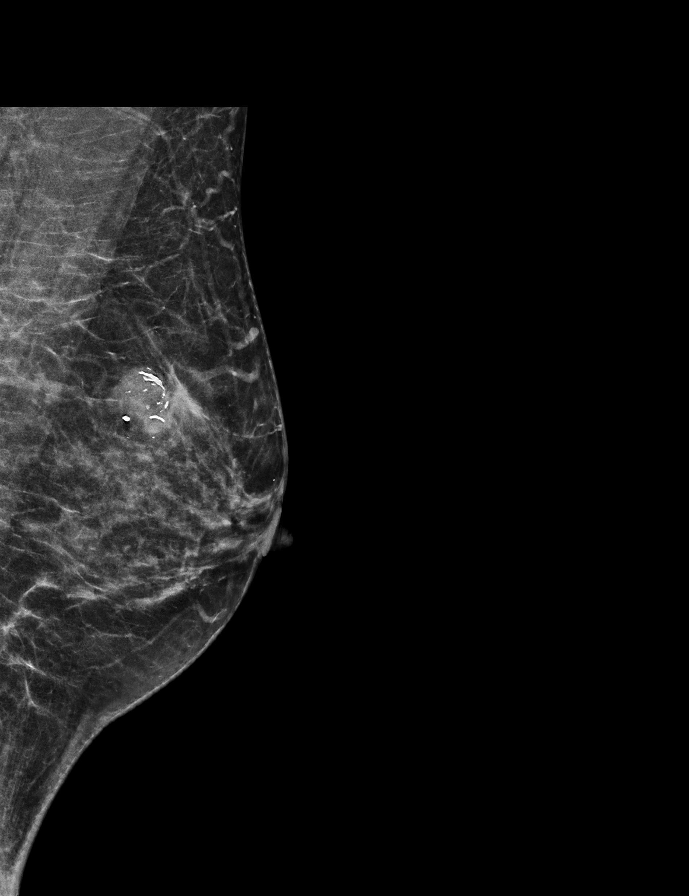

[L CC synth-2D]
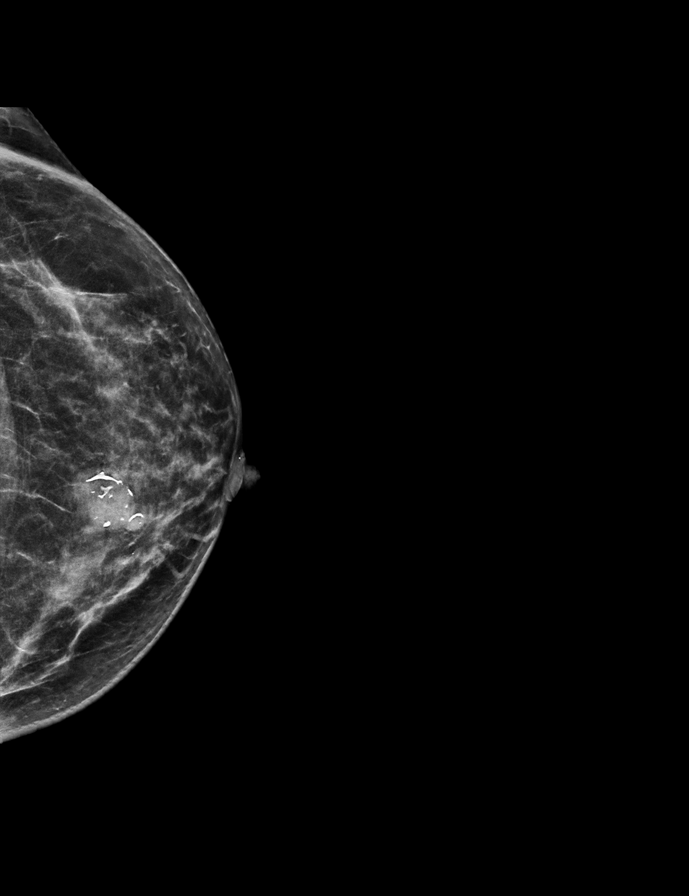

[R CC synth-2D]
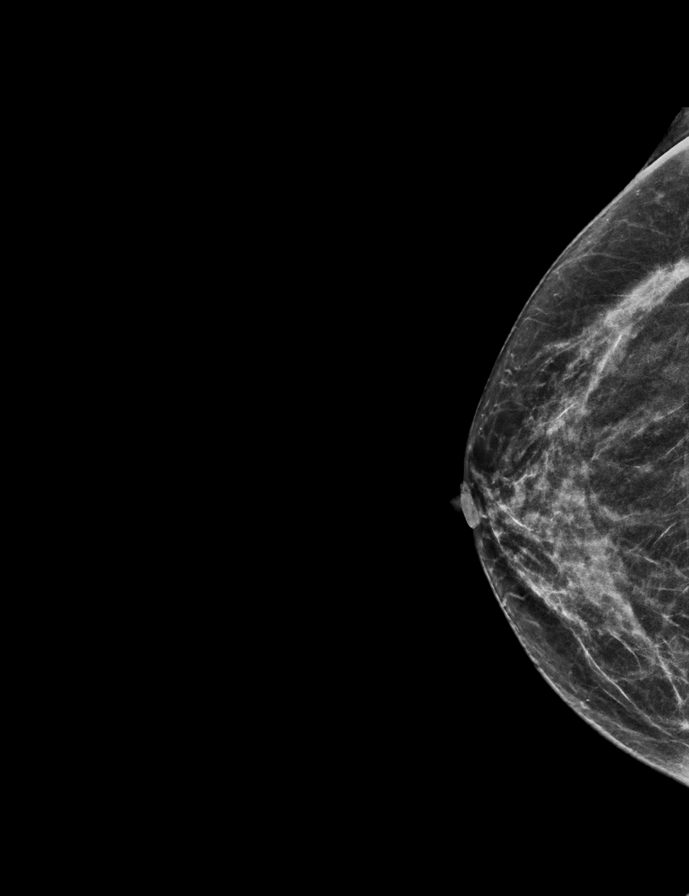

[R MLO synth-2D]
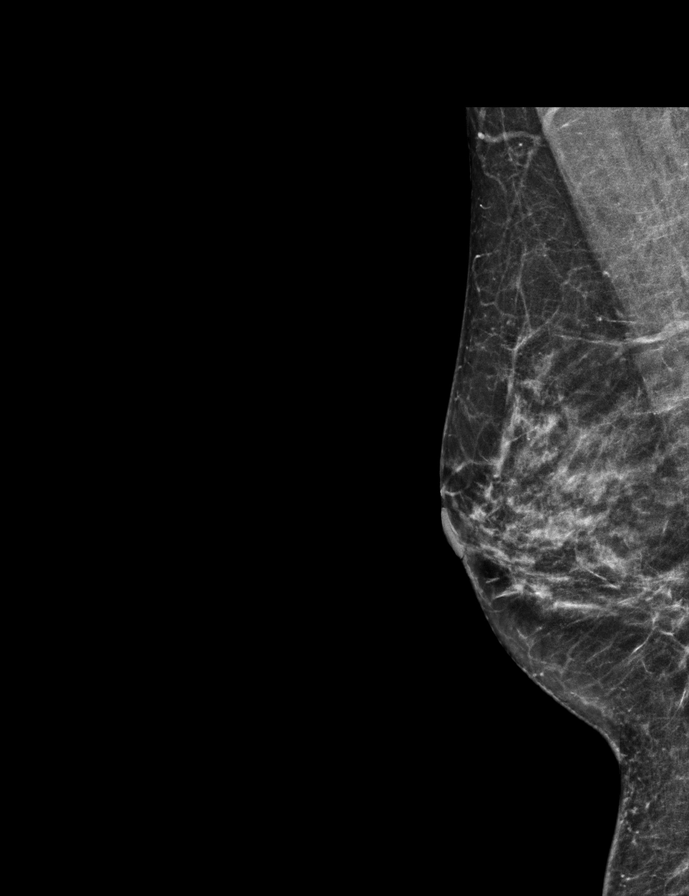

[L CC tomo · tomo slice 25/50.0]
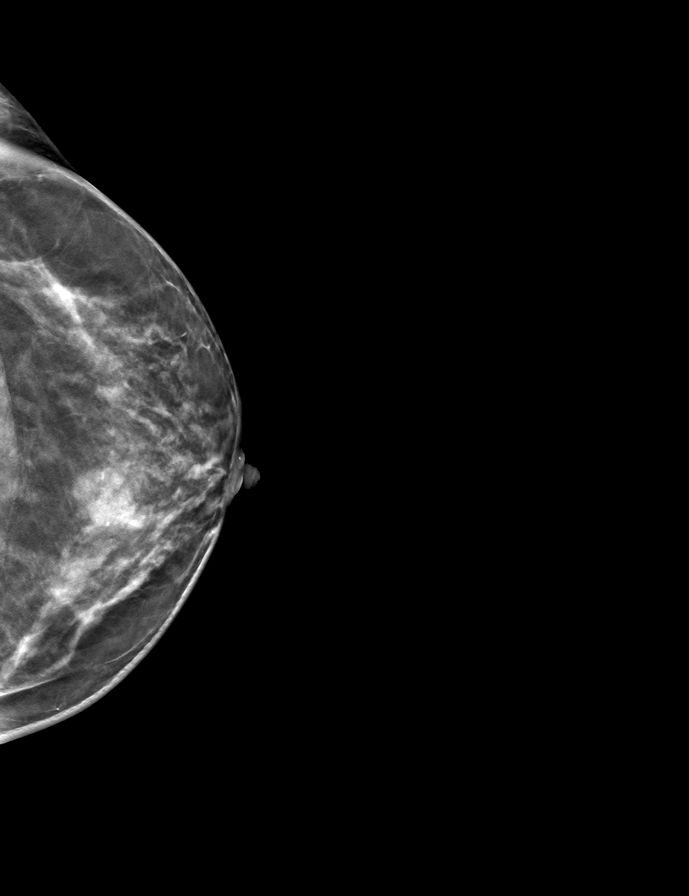

[L MLO tomo · tomo slice 24/47.0]
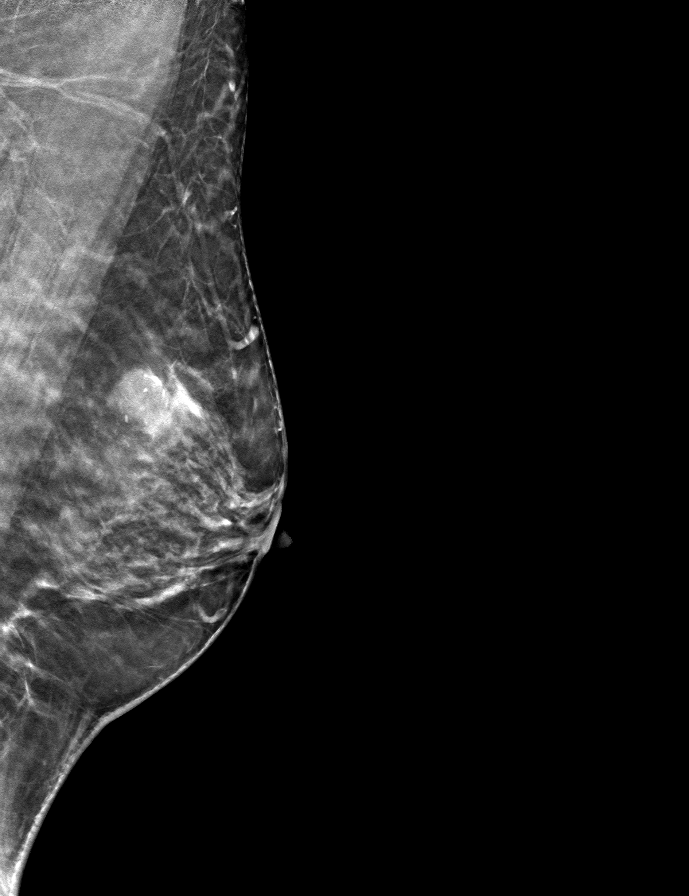

[R MLO tomo · tomo slice 27/52.0]
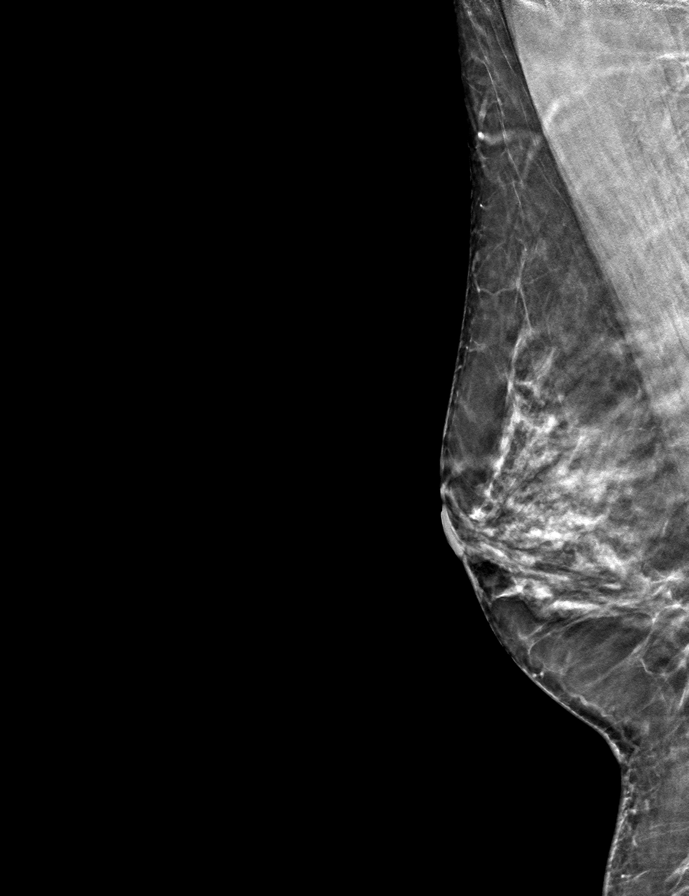

[R CC tomo · tomo slice 27/52.0]
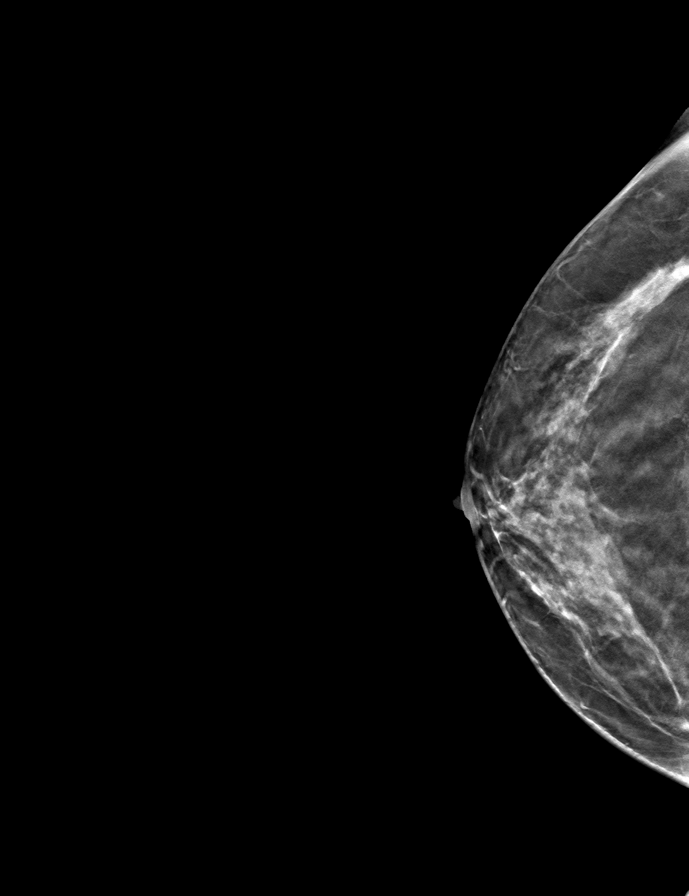

[8 of 24 positions shown; findings below may reference images not displayed]

ACR Breast Density Category c: The breast tissue is heterogeneously
dense, which may obscure small masses.
FINDINGS: The previously demonstrated oval, circumscribed mass in the upper
inner quadrant of the left breast with coarse calcifications appears
somewhat smaller with increased coarseness of the calcifications. No
findings elsewhere in either breast suspicious for malignancy.

Mammographic images were processed with CAD.

Targeted ultrasound is performed, showing a 1.7 x 1.5 x 1.2 cm oval,
obliquely oriented, circumscribed, hypoechoic mass containing coarse
calcifications in the 11 o'clock position of the left breast, 3 cm
from the nipple. This has not changed significantly in size since
06/06/2017 and 09/01/2015.
IMPRESSION: 1. Benign left breast degenerated fibroadenoma. This does not need
further follow-up.
2. No evidence of malignancy in either breast.

RECOMMENDATION:
Bilateral screening mammogram in 1 year.

I have discussed the findings and recommendations with the patient.
Results were also provided in writing at the conclusion of the
visit. If applicable, a reminder letter will be sent to the patient
regarding the next appointment.

BI-RADS CATEGORY  2: Benign.

## 2019-01-26 ENCOUNTER — Ambulatory Visit (INDEPENDENT_AMBULATORY_CARE_PROVIDER_SITE_OTHER): Payer: BC Managed Care – PPO | Admitting: Family Medicine

## 2019-01-26 ENCOUNTER — Encounter: Payer: Self-pay | Admitting: Family Medicine

## 2019-01-26 ENCOUNTER — Other Ambulatory Visit: Payer: Self-pay

## 2019-01-26 DIAGNOSIS — J0110 Acute frontal sinusitis, unspecified: Secondary | ICD-10-CM | POA: Diagnosis not present

## 2019-01-26 MED ORDER — FLUTICASONE PROPIONATE 50 MCG/ACT NA SUSP: 2.0000 | Freq: Every day | NASAL | 3 refills | Status: DC

## 2019-01-26 NOTE — Patient Instructions (Addendum)
1. It sounds like you have persistent Sinus Congestion or "Rhinosinusitis" - I do not think that this is a Bacterial Sinus Infection. Usually these are caused by Viruses or Allergies, and will run it's course in about 7 to 10 days. - No antibiotics are needed - Start Resume Loratadine (Claritin) 10mg  daily and Flonase 2 sprays in each nostril daily for next 4-6 weeks, then you may stop and use seasonally or as needed - Recommend to start keep using Nasal Saline spray multiple times a day to help flush out congestion and clear sinuses - Improve hydration by drinking plenty of clear fluids (water, gatorade) to reduce secretions and thin congestion - Congestion draining down throat can cause irritation. May try warm herbal tea with honey, cough drops - Can take Tylenol or Ibuprofen as needed for fevers - May continue over the counter cold medicine as you are, I would not use any decongestant or mucinex longer than 7 days.  Hold antibiotics, may contact us Thursday morning mychart or phone if not improved can send in Augmentin antibiotic.  If you develop persistent fever >101F for at least 3 consecutive days, headaches with sinus pain or pressure or persistent earache, please schedule a follow-up evaluation within next few days to week.   Please schedule a Follow-up Appointment to: Return in about 1 week (around 02/02/2019), or if symptoms worsen or fail to improve, for sinusitis.  If you have any other questions or concerns, please feel free to call the office or send a message through [place]. You may also schedule an earlier appointment if necessary.  Additionally, you may be receiving a survey about your experience at our office within a few days to 1 week by e-mail or mail. We value your feedback.  Nobie Putnam, DO [place]

## 2019-01-26 NOTE — Progress Notes (Signed)
Virtual Visit via Telephone The purpose of this virtual visit is to provide medical care while limiting exposure to the novel coronavirus (COVID19) for both patient and office staff.  Consent was obtained for phone visit:  Yes.   Answered questions that patient had about telehealth interaction:  Yes.   I discussed the limitations, risks, security and privacy concerns of performing an evaluation and management service by telephone. I also discussed with the patient that there may be a patient responsible charge related to this service. The patient expressed understanding and agreed to proceed.  Patient Location: Home Provider Location: [facility] (Office)  PCP is Cassell Smiles, AGPCNP-BC - I am currently covering during her maternity leave.   ---------------------------------------------------------------------- Chief Complaint  Patient presents with  . Sinus Problem    onset 5 days     S: Reviewed CMA documentation. I have called patient and gathered additional HPI as follows:  ACUTE SINUSITIS Reports that symptoms started about 5 days ago, sinus congestion, sneezing, post nasal drainage and pressure behind ears. - Tried OTC decongestant sudafed. Benadryl nightly PRN. - History of good results with flonase per ENT in past Denies any high risk travel to areas of current concern for COVID19. Denies any known or suspected exposure to person with or possibly with COVID19.  Denies any fevers, chills, sweats, body ache, cough, shortness of breath, abdominal pain, diarrhea  -------------------------------------------------------------------------- O: No physical exam performed due to remote telephone encounter.  -------------------------------------------------------------------------- A&P:   Suspected Acute Sinusitis, possible for benign allergic vs viral etiology at onset Not consistent bacteria infection, duration 5 days,without fever or sinus pain, purulence - No  comorbid pulmonary conditions (asthma, COPD) or immunocompromise  - Currently patient is LOW RISK for COVID19 based on current symptoms Already has a COVID19 test pending that she got last week.  1. Start nasal steroid Flonase 2 sprays in each nostril daily for 4-6 weeks, may repeat course seasonally or as needed 2. Start OTC Claritin / Zyrtec daily anti histamine 3. Can use Sudafed behind the counter, show ID to pharmacist advised this is stronger than OTC version 4. Can use mucinex PRN 5. If not improve contact 48 hours, we can send in augmentin antibiotic if present or worsening >7 days  No orders of the defined types were placed in this encounter.     If symptoms do not resolve or significantly improve OR if WORSENING - fever / cough - or worsening shortness of breath - then should contact us and seek advice on next steps in treatment at home vs where/when to seek care at Urgent Care or Hospital ED for further intervention and possible testing if indicated.  Patient verbalizes understanding with the above medical recommendations including the limitation of remote medical advice.  Specific follow-up / call-back criteria were given for patient to follow-up or seek medical care more urgently if needed.   - Time spent in direct consultation with patient on phone: 8 minutes   Nobie Putnam, [facility] 01/26/2019, 11:36 AM

## 2019-04-02 ENCOUNTER — Encounter: Payer: BLUE CROSS/BLUE SHIELD | Admitting: Nurse Practitioner

## 2019-04-04 DIAGNOSIS — Z20828 Contact with and (suspected) exposure to other viral communicable diseases: Secondary | ICD-10-CM | POA: Diagnosis not present

## 2019-04-08 ENCOUNTER — Encounter: Payer: BLUE CROSS/BLUE SHIELD | Admitting: Nurse Practitioner

## 2019-04-15 ENCOUNTER — Encounter: Payer: Self-pay | Admitting: Nurse Practitioner

## 2019-04-15 ENCOUNTER — Ambulatory Visit: Payer: BC Managed Care – PPO | Admitting: Nurse Practitioner

## 2019-04-15 ENCOUNTER — Other Ambulatory Visit: Payer: Self-pay

## 2019-04-15 VITALS — BP 136/92 | HR 85 | Ht 64.0 in | Wt 135.8 lb

## 2019-04-15 DIAGNOSIS — R030 Elevated blood-pressure reading, without diagnosis of hypertension: Secondary | ICD-10-CM

## 2019-04-15 DIAGNOSIS — Z23 Encounter for immunization: Secondary | ICD-10-CM | POA: Diagnosis not present

## 2019-04-15 NOTE — Progress Notes (Signed)
Subjective:    Patient ID: Tasha Simmons, female    DOB: Nov 06, 1970, 48 y.o.   MRN: [id number]  Tasha Simmons is a 48 y.o. female presenting on 04/15/2019 for Hypertension (elevated blood presure ranging about 130/90's )   HPI  Elevated blood pressure readings First noted hypertension about 3 months ago with headache and associated BP of 130/90.  Patient continued checking BP and noted BP reading of 122/75 several days after.  About 2 days out of the week has higher BP.  Only sometimes assocated with headache.   - Pt denies lightheadedness, dizziness, changes in vision, chest tightness/pressure, palpitations, leg swelling, sudden loss of speech or loss of consciousness.   Diet: generally regular diet -  4 days per week eats food from restaurants. - Eats only one meal per day around 3 pm.  No other food usually.   If patient cooks, is mostly fried foods at home with pre-prepared breading (high sodium).    Activity: sedentary at home.  Moving frequently with work - moderately active at [employer] for [employer].  Social History   Tobacco Use  . Smoking status: Current Every Day Smoker    Packs/day: 0.25    Years: 13.00    Pack years: 3.25  . Smokeless tobacco: Never Used  Substance Use Topics  . Alcohol use: No  . Drug use: No    Review of Systems Per HPI unless specifically indicated above     Objective:    BP (!) 136/92 (BP Location: Right Arm, Patient Position: Sitting, Cuff Size: Normal)   Pulse 85   Ht 5\' 4"  (1.626 m)   Wt 135 lb 12.8 oz (61.6 kg)   LMP 10/20/2017 (Exact Date)   BMI 23.31 kg/m   Wt Readings from Last 3 Encounters:  04/15/19 135 lb 12.8 oz (61.6 kg)  08/07/18 134 lb 9.6 oz (61.1 kg)  05/22/18 130 lb (59 kg)    Physical Exam Vitals signs reviewed.  Constitutional:      General: She is awake. She is not in acute distress.    Appearance: She is well-developed.  HENT:     Head: Normocephalic and atraumatic.  Neck:     Musculoskeletal:  Normal range of motion and neck supple.     Vascular: No carotid bruit.  Cardiovascular:     Rate and Rhythm: Normal rate and regular rhythm.     Pulses: Normal pulses.          Radial pulses are 2+ on the right side and 2+ on the left side.       Posterior tibial pulses are 2+ on the right side and 2+ on the left side.     Heart sounds: Normal heart sounds, S1 normal and S2 normal. No murmur. No friction rub. No gallop.   Pulmonary:     Effort: Pulmonary effort is normal. No respiratory distress.     Breath sounds: Normal breath sounds and air entry.  Musculoskeletal:     Right lower leg: No edema.     Left lower leg: No edema.  Skin:    General: Skin is warm and dry.  Neurological:     Mental Status: She is alert and oriented to person, place, and time.  Psychiatric:        Attention and Perception: Attention normal.        Mood and Affect: Mood and affect normal.        Behavior: Behavior normal. Behavior is cooperative.  Results for orders placed or performed in visit on 08/07/18  Urine Culture   Specimen: Urine  Result Value Ref Range   MICRO NUMBER: [id number]    SPECIMEN QUALITY: Adequate    Sample Source NOT GIVEN    STATUS: FINAL    ISOLATE 1: Escherichia coli (A)       Susceptibility   Escherichia coli - URINE CULTURE, REFLEX    AMOX/CLAVULANIC 4 Sensitive     AMPICILLIN 4 Sensitive     AMPICILLIN/SULBACTAM <=2 Sensitive     CEFAZOLIN* <=4 Not Reportable      * For infections other than uncomplicated UTIcaused by E. coli, K. pneumoniae or P. mirabilis:Cefazolin is resistant if MIC > or = 8 mcg/mL.(Distinguishing susceptible versus intermediatefor isolates with MIC < or = 4 mcg/mL requiresadditional testing.)For uncomplicated UTI caused by E. coli,K. pneumoniae or P. mirabilis: Cefazolin issusceptible if MIC <32 mcg/mL and predictssusceptible to the oral agents cefaclor, cefdinir,cefpodoxime, cefprozil, cefuroxime, cephalexinand loracarbef.    CEFEPIME <=1 Sensitive      CEFTRIAXONE <=1 Sensitive     CIPROFLOXACIN <=0.25 Sensitive     LEVOFLOXACIN <=0.12 Sensitive     ERTAPENEM <=0.5 Sensitive     GENTAMICIN <=1 Sensitive     IMIPENEM <=0.25 Sensitive     NITROFURANTOIN <=16 Sensitive     PIP/TAZO <=4 Sensitive     TOBRAMYCIN <=1 Sensitive     TRIMETH/SULFA* <=20 Sensitive      * For infections other than uncomplicated UTIcaused by E. coli, K. pneumoniae or P. mirabilis:Cefazolin is resistant if MIC > or = 8 mcg/mL.(Distinguishing susceptible versus intermediatefor isolates with MIC < or = 4 mcg/mL requiresadditional testing.)For uncomplicated UTI caused by E. coli,K. pneumoniae or P. mirabilis: Cefazolin issusceptible if MIC <32 mcg/mL and predictssusceptible to the oral agents cefaclor, cefdinir,cefpodoxime, cefprozil, cefuroxime, cephalexinand loracarbef.Legend:S = Susceptible  I = IntermediateR = Resistant  NS = Not susceptible* = Not tested  NR = Not reported**NN = See antimicrobic comments  POCT Urinalysis Dipstick  Result Value Ref Range   Color, UA dark    Clarity, UA clear    Glucose, UA Negative Negative   Bilirubin, UA Negative    Ketones, UA Negative    Spec Grav, UA 1.010 1.010 - 1.025   Blood, UA large    pH, UA 5.0 5.0 - 8.0   Protein, UA Negative Negative   Urobilinogen, UA 0.2 0.2 or 1.0 E.U./dL   Nitrite, UA positive    Leukocytes, UA Large (3+) (A) Negative   Appearance     Odor        Assessment & Plan:   Problem List Items Addressed This Visit    None    Visit Diagnoses    Elevated blood pressure reading    -  Primary Patient with several elevated readings > 130/80 at home. No readings > 140/90.  Stage I hypertension likely without complications.  - Start with lifestyle modification. Discussed low salt diet, reducing fried foods, lowering sodium in prepared foods/seasonings.  Eat more meals prepared from home. - Increase activity to 30 minutes most days per week.  Patient already with plan to begin walking program on days  off. - Follow-up 3 months after lifestyle to consider need for medications. Patient will need labs at that appointment for kidney function, electrolytes.    Needs flu shot       Relevant Orders   Flu Vaccine QUAD 6+ mos PF IM (Fluarix Quad PF) (Completed)  Follow up plan: Return in about 3 months (around 07/15/2019) for hypertension.  A total of 18 minutes was spent face-to-face with this patient. Greater than 50% of this time was spent in counseling and coordination of care with the patient for hypertension education, lifestyle recommendations (heart healthy diet, activity), medications.    Cassell Smiles, DNP, AGPCNP-BC Adult Gerontology Primary Care Nurse Practitioner [facility] 04/15/2019, 2:47 PM

## 2019-04-15 NOTE — Patient Instructions (Addendum)
Tasha Simmons,   Thank you for coming in to clinic today.  1. Blood pressure is only mildly elevated today. - START reducing sodium (salt) to 2000 mg daily or about 1 teaspoon for your whole day in all foods, including processed foods.  2. START exercising 150 minutes per week or 30 minutes most days of the week (5).  Please schedule a follow-up appointment with Cassell Smiles, AGNP. Return in about 3 months (around 07/15/2019) for hypertension.  If you have any other questions or concerns, please feel free to call the clinic or send a message through [facility]. You may also schedule an earlier appointment if necessary.  You will receive a survey after today's visit either digitally by e-mail or paper by [employer]. Your experiences and feedback matter to [place].  Please respond so we know how we are doing as we provide care for you.  Cassell Smiles, DNP, AGNP-BC Adult Gerontology Nurse Practitioner [facility], [facility]  Managing Your Hypertension Hypertension is commonly called high blood pressure. This is when the force of your blood pressing against the walls of your arteries is too strong. Arteries are blood vessels that carry blood from your heart throughout your body. Hypertension forces the heart to work harder to pump blood, and may cause the arteries to become narrow or stiff. Having untreated or uncontrolled hypertension can cause heart attack, stroke, kidney disease, and other problems. What are blood pressure readings? A blood pressure reading consists of a higher number over a lower number. Ideally, your blood pressure should be below 120/80. The first ("top") number is called the systolic pressure. It is a measure of the pressure in your arteries as your heart beats. The second ("bottom") number is called the diastolic pressure. It is a measure of the pressure in your arteries as the heart relaxes. What does my blood pressure reading mean? Blood pressure is classified into  four stages. Based on your blood pressure reading, your health care provider may use the following stages to determine what type of treatment you need, if any. Systolic pressure and diastolic pressure are measured in a unit called mm Hg. Normal  Systolic pressure: below 123456.  Diastolic pressure: below 80. Elevated  Systolic pressure: [id number].  Diastolic pressure: below 80. Hypertension stage 1  Systolic pressure: 0000000.  Diastolic pressure: [id number]. Hypertension stage 2  Systolic pressure: [id number] or above.  Diastolic pressure: 90 or above. What health risks are associated with hypertension? Managing your hypertension is an important responsibility. Uncontrolled hypertension can lead to:  A heart attack.  A stroke.  A weakened blood vessel (aneurysm).  Heart failure.  Kidney damage.  Eye damage.  Metabolic syndrome.  Memory and concentration problems. What changes can I make to manage my hypertension? Hypertension can be managed by making lifestyle changes and possibly by taking medicines. Your health care provider will help you make a plan to bring your blood pressure within a normal range. Eating and drinking   Eat a diet that is high in fiber and potassium, and low in salt (sodium), added sugar, and fat. An example eating plan is called the DASH (Dietary Approaches to Stop Hypertension) diet. To eat this way: ? Eat plenty of fresh fruits and vegetables. Try to fill half of your plate at each meal with fruits and vegetables. ? Eat whole grains, such as whole wheat pasta, brown rice, or whole grain bread. Fill about one quarter of your plate with whole grains. ? Eat low-fat diary products. ?  Avoid fatty cuts of meat, processed or cured meats, and poultry with skin. Fill about one quarter of your plate with lean proteins such as fish, chicken without skin, beans, eggs, and tofu. ? Avoid premade and processed foods. These tend to be higher in sodium, added sugar, and  fat.  Reduce your daily sodium intake. Most people with hypertension should eat less than 1,500 mg of sodium a day.  Limit alcohol intake to no more than 1 drink a day for nonpregnant women and 2 drinks a day for men. One drink equals 12 oz of beer, 5 oz of wine, or 1 oz of hard liquor. Lifestyle  Work with your health care provider to maintain a healthy body weight, or to lose weight. Ask what an ideal weight is for you.  Get at least 30 minutes of exercise that causes your heart to beat faster (aerobic exercise) most days of the week. Activities may include walking, swimming, or biking.  Include exercise to strengthen your muscles (resistance exercise), such as weight lifting, as part of your weekly exercise routine. Try to do these types of exercises for 30 minutes at least 3 days a week.  Do not use any products that contain nicotine or tobacco, such as cigarettes and e-cigarettes. If you need help quitting, ask your health care provider.  Control any long-term (chronic) conditions you have, such as high cholesterol or diabetes. Monitoring  Monitor your blood pressure at home as told by your health care provider. Your personal target blood pressure may vary depending on your medical conditions, your age, and other factors.  Have your blood pressure checked regularly, as often as told by your health care provider. Working with your health care provider  Review all the medicines you take with your health care provider because there may be side effects or interactions.  Talk with your health care provider about your diet, exercise habits, and other lifestyle factors that may be contributing to hypertension.  Visit your health care provider regularly. Your health care provider can help you create and adjust your plan for managing hypertension. Will I need medicine to control my blood pressure? Your health care provider may prescribe medicine if lifestyle changes are not enough to get  your blood pressure under control, and if:  Your systolic blood pressure is 130 or higher.  Your diastolic blood pressure is 80 or higher. Take medicines only as told by your health care provider. Follow the directions carefully. Blood pressure medicines must be taken as prescribed. The medicine does not work as well when you skip doses. Skipping doses also puts you at risk for problems. Contact a health care provider if:  You think you are having a reaction to medicines you have taken.  You have repeated (recurrent) headaches.  You feel dizzy.  You have swelling in your ankles.  You have trouble with your vision. Get help right away if:  You develop a severe headache or confusion.  You have unusual weakness or numbness, or you feel faint.  You have severe pain in your chest or abdomen.  You vomit repeatedly.  You have trouble breathing. Summary  Hypertension is when the force of blood pumping through your arteries is too strong. If this condition is not controlled, it may put you at risk for serious complications.  Your personal target blood pressure may vary depending on your medical conditions, your age, and other factors. For most people, a normal blood pressure is less than 120/80.  Hypertension is  managed by lifestyle changes, medicines, or both. Lifestyle changes include weight loss, eating a healthy, low-sodium diet, exercising more, and limiting alcohol. This information is not intended to replace advice given to you by your health care provider. Make sure you discuss any questions you have with your health care provider. Document Released: 04/08/2012 Document Revised: 11/06/2018 Document Reviewed: 06/12/2016 Elsevier Patient Education  [employer].    Influenza (Flu) Vaccine (Inactivated or Recombinant): What You Need to Know 1. Why get vaccinated? Influenza vaccine can prevent influenza (flu). Flu is a contagious disease that spreads around the [place] every year, usually between October and May. Anyone can get the flu, but it is more dangerous for some people. Infants and young children, people 32 years of age and older, pregnant women, and people with certain health conditions or a weakened immune system are at greatest risk of flu complications. Pneumonia, bronchitis, sinus infections and ear infections are examples of flu-related complications. If you have a medical condition, such as heart disease, cancer or diabetes, flu can make it worse. Flu can cause fever and chills, sore throat, muscle aches, fatigue, cough, headache, and runny or stuffy nose. Some people may have vomiting and diarrhea, though this is more common in children than adults. Each year thousands of people in the [place] die from flu, and many more are hospitalized. Flu vaccine prevents millions of illnesses and flu-related visits to the doctor each year. 2. Influenza vaccine CDC recommends everyone 79 months of age and older get vaccinated every flu season. Children 6 months through 1 years of age may need 2 doses during a single flu season. Everyone else needs only 1 dose each flu season. It takes about 2 weeks for protection to develop after vaccination. There are many flu viruses, and they are always changing. Each year a new flu vaccine is made to protect against three or four viruses that are likely to cause disease in the upcoming flu season. Even when the vaccine doesn't exactly match these viruses, it may still provide some protection. Influenza vaccine does not cause flu. Influenza vaccine may be given at the same time as other vaccines. 3. Talk with your health care provider Tell your vaccine provider if the person getting the vaccine:  Has had an allergic reaction after a previous dose of influenza vaccine, or has any severe, life-threatening allergies.  Has ever had Guillain-Barr Syndrome (also called GBS). In some cases, your health care provider may  decide to postpone influenza vaccination to a future visit. People with minor illnesses, such as a cold, may be vaccinated. People who are moderately or severely ill should usually wait until they recover before getting influenza vaccine. Your health care provider can give you more information. 4. Risks of a vaccine reaction  Soreness, redness, and swelling where shot is given, fever, muscle aches, and headache can happen after influenza vaccine.  There may be a very small increased risk of Guillain-Barr Syndrome (GBS) after inactivated influenza vaccine (the flu shot). Young children who get the flu shot along with pneumococcal vaccine (PCV13), and/or DTaP vaccine at the same time might be slightly more likely to have a seizure caused by fever. Tell your health care provider if a child who is getting flu vaccine has ever had a seizure. People sometimes faint after medical procedures, including vaccination. Tell your provider if you feel dizzy or have vision changes or ringing in the ears. As with any medicine, there is a very remote chance  of a vaccine causing a severe allergic reaction, other serious injury, or death. 5. What if there is a serious problem? An allergic reaction could occur after the vaccinated person leaves the clinic. If you see signs of a severe allergic reaction (hives, swelling of the face and throat, difficulty breathing, a fast heartbeat, dizziness, or weakness), call 9-1-1 and get the person to the nearest hospital. For other signs that concern you, call your health care provider. Adverse reactions should be reported to the Vaccine Adverse Event Reporting System (VAERS). Your health care provider will usually file this report, or you can do it yourself. Visit the VAERS website at www.vaers.SamedayNews.es or call (780)839-3706.VAERS is only for reporting reactions, and VAERS staff do not give medical advice. 6. The National Vaccine Injury Compensation Program The [employer] Vaccine  Injury Compensation Program (VICP) is a federal program that was created to compensate people who may have been injured by certain vaccines. Visit the VICP website at GoldCloset.com.ee or call 574-870-0834 to learn about the program and about filing a claim. There is a time limit to file a claim for compensation. 7. How can I learn more?  Ask your healthcare provider.  Call your local or state health department.  Contact the Centers for Disease Control and Prevention (CDC): ? Call 203-286-5632 (1-800-CDC-INFO) or ? Visit CDC's https://gibson.com/ Vaccine Information Statement (Interim) Inactivated Influenza Vaccine (03/12/2018) This information is not intended to replace advice given to you by your health care provider. Make sure you discuss any questions you have with your health care provider. Document Released: 05/09/2006 Document Revised: 11/03/2018 Document Reviewed: 03/16/2018 Elsevier Patient Education  2020 [employer].

## 2019-04-21 ENCOUNTER — Encounter: Payer: Self-pay | Admitting: Nurse Practitioner

## 2019-06-16 IMAGING — US US BREAST*L* LIMITED INC AXILLA
1 series · 6 of 6 positions shown · non-contrast
Comparison: Previous exam(s).

CLINICAL DATA: Follow-up left breast probable fibroadenoma.

EXAM:
DIGITAL DIAGNOSTIC BILATERAL MAMMOGRAM WITH CAD AND TOMO
ULTRASOUND LEFT BREAST

[Series 1: us breast*left* limited inc axilla · 0.06mm/px · 6 of 6 slices shown]
[im 1/6]
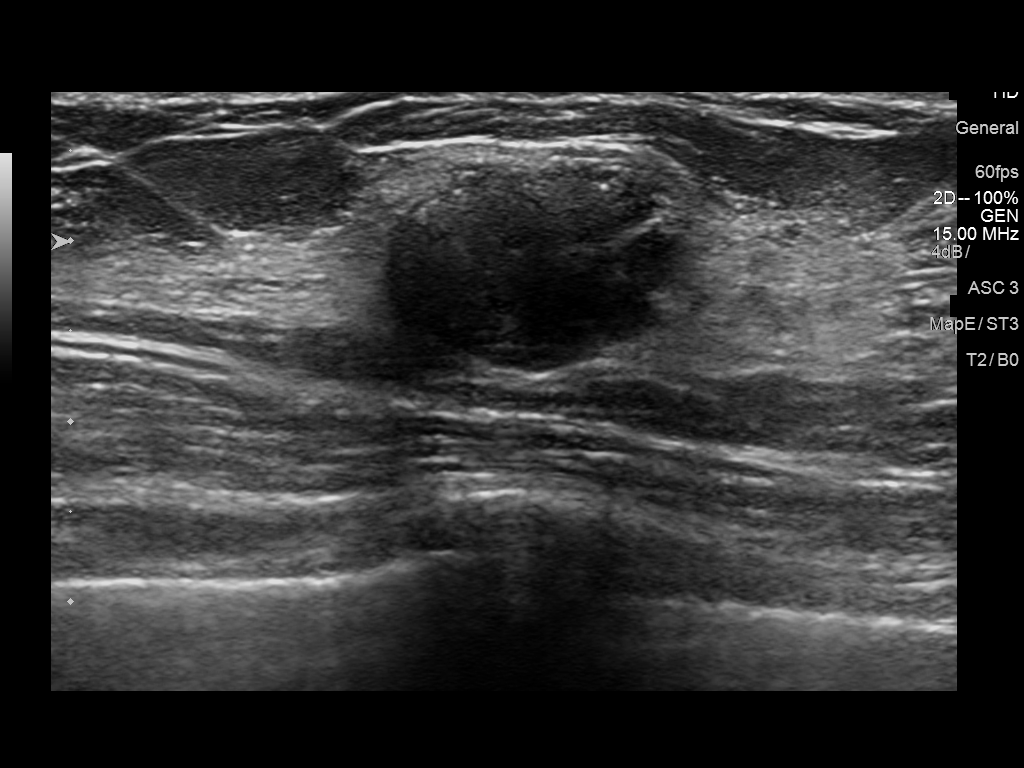
[im 2/6]
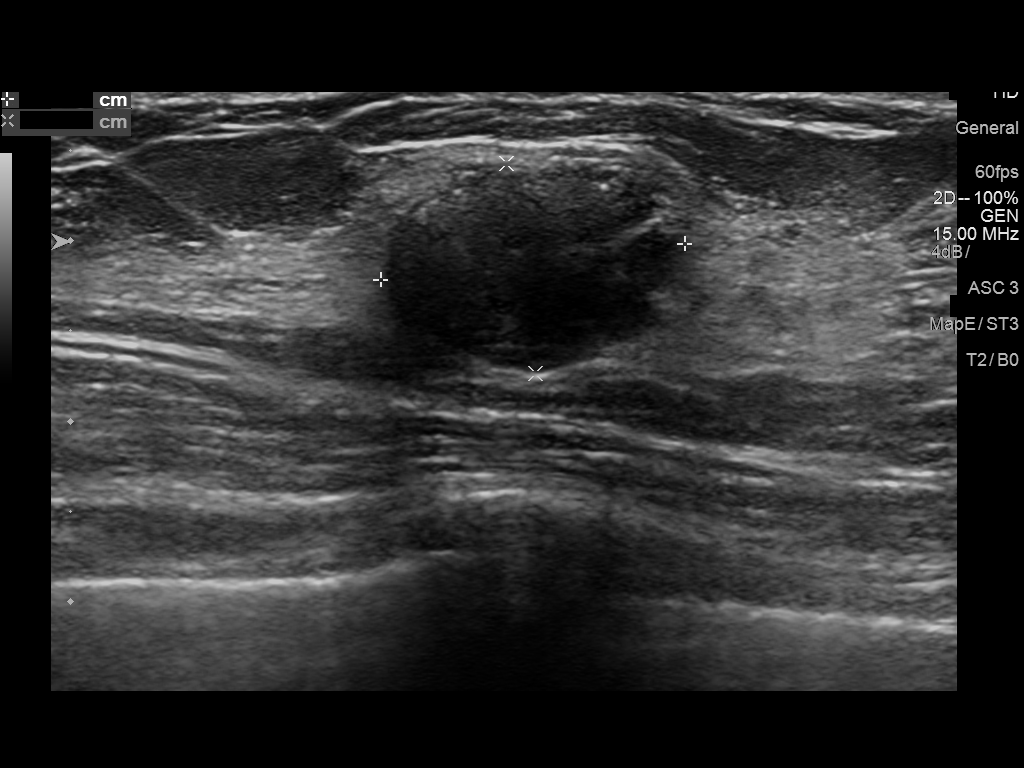
[im 3/6]
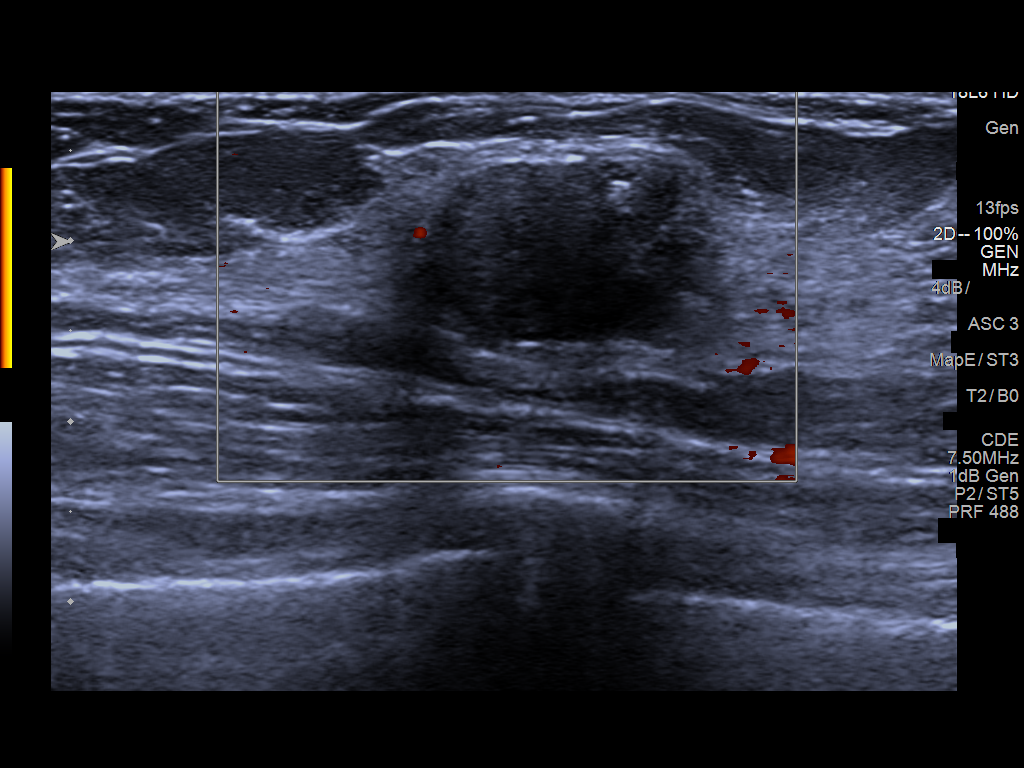
[im 4/6]
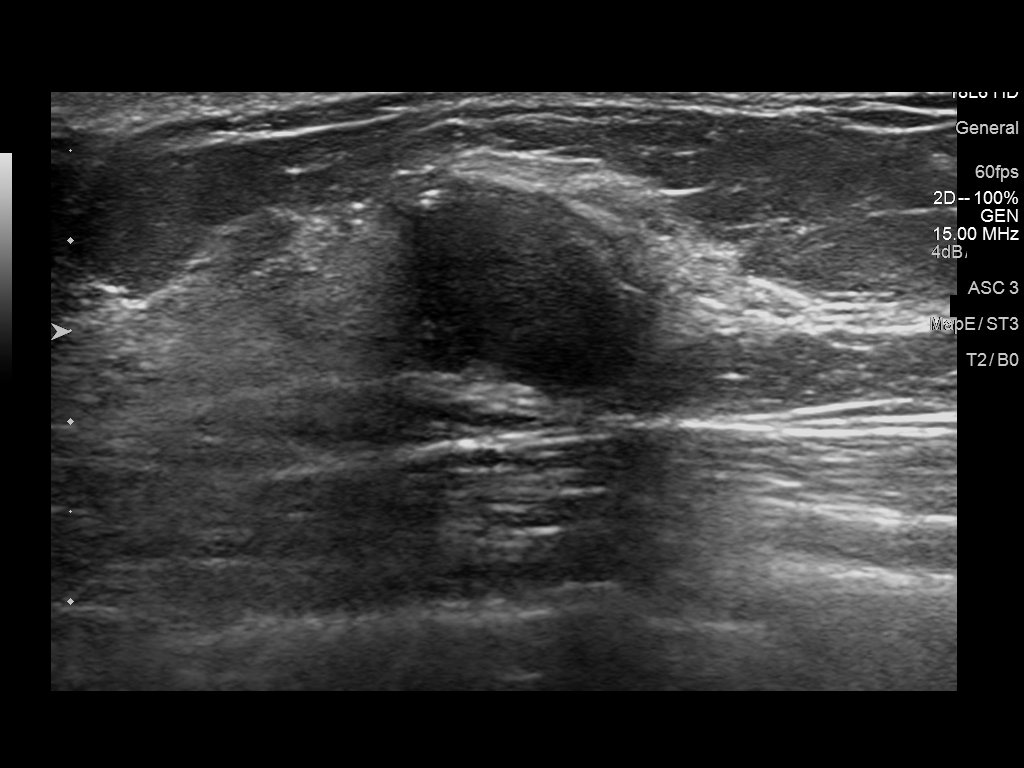
[im 5/6]
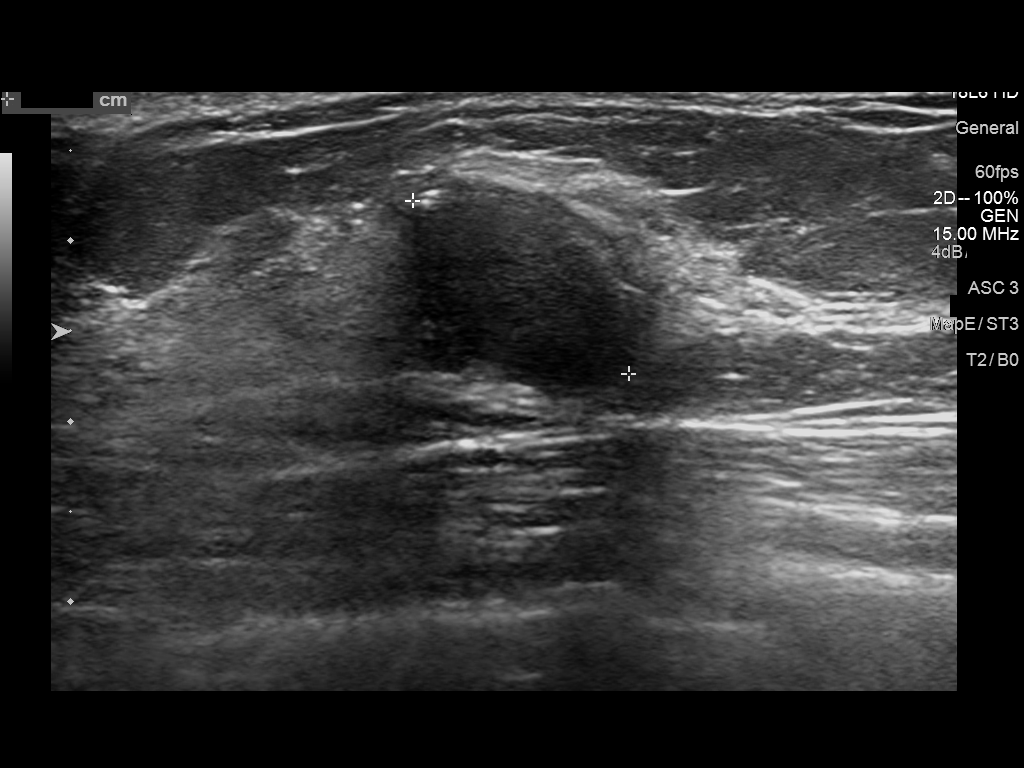
[im 6/6]
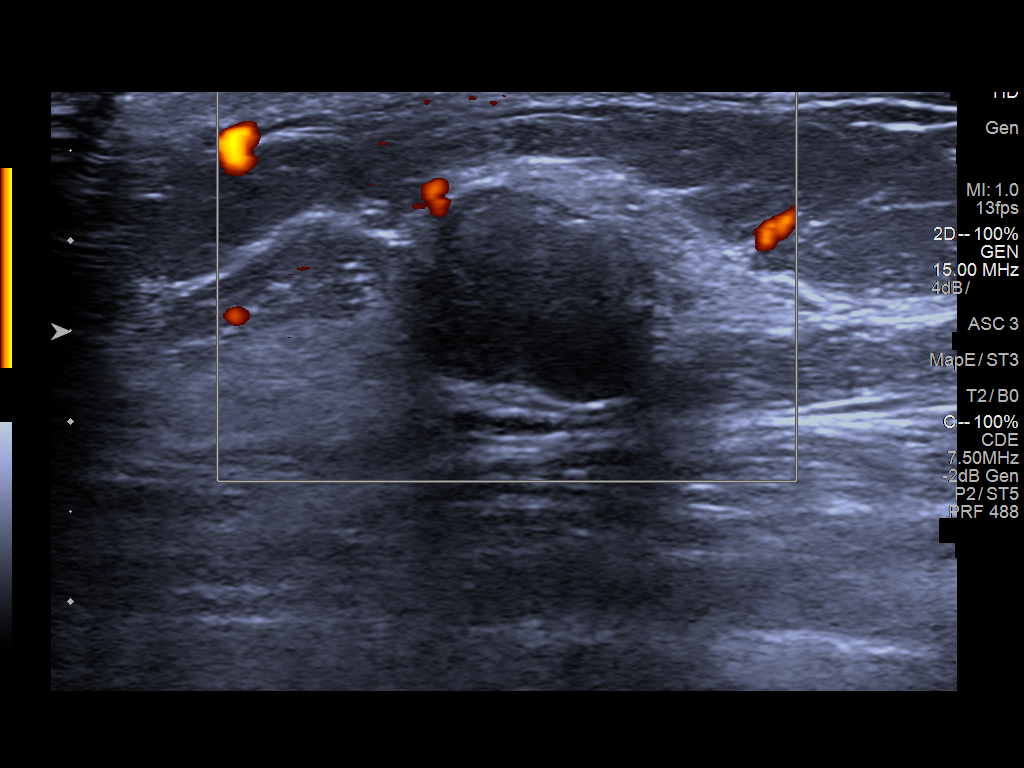

[6 of 6 positions shown; findings below may reference images not displayed]

ACR Breast Density Category c: The breast tissue is heterogeneously
dense, which may obscure small masses.
FINDINGS: The previously demonstrated oval, circumscribed mass in the upper
inner quadrant of the left breast with coarse calcifications appears
somewhat smaller with increased coarseness of the calcifications. No
findings elsewhere in either breast suspicious for malignancy.

Mammographic images were processed with CAD.

Targeted ultrasound is performed, showing a 1.7 x 1.5 x 1.2 cm oval,
obliquely oriented, circumscribed, hypoechoic mass containing coarse
calcifications in the 11 o'clock position of the left breast, 3 cm
from the nipple. This has not changed significantly in size since
06/06/2017 and 09/01/2015.
IMPRESSION: 1. Benign left breast degenerated fibroadenoma. This does not need
further follow-up.
2. No evidence of malignancy in either breast.

RECOMMENDATION:
Bilateral screening mammogram in 1 year.

I have discussed the findings and recommendations with the patient.
Results were also provided in writing at the conclusion of the
visit. If applicable, a reminder letter will be sent to the patient
regarding the next appointment.

BI-RADS CATEGORY  2: Benign.

## 2020-03-24 DIAGNOSIS — B9689 Other specified bacterial agents as the cause of diseases classified elsewhere: Secondary | ICD-10-CM | POA: Diagnosis not present

## 2020-03-24 DIAGNOSIS — H66001 Acute suppurative otitis media without spontaneous rupture of ear drum, right ear: Secondary | ICD-10-CM | POA: Diagnosis not present

## 2020-03-24 DIAGNOSIS — J209 Acute bronchitis, unspecified: Secondary | ICD-10-CM | POA: Diagnosis not present

## 2020-03-24 DIAGNOSIS — J019 Acute sinusitis, unspecified: Secondary | ICD-10-CM | POA: Diagnosis not present

## 2020-03-24 DIAGNOSIS — Z20822 Contact with and (suspected) exposure to covid-19: Secondary | ICD-10-CM | POA: Diagnosis not present

## 2020-04-04 ENCOUNTER — Telehealth: Payer: BC Managed Care – PPO | Admitting: Family Medicine

## 2020-04-04 DIAGNOSIS — H6983 Other specified disorders of Eustachian tube, bilateral: Secondary | ICD-10-CM | POA: Diagnosis not present

## 2020-04-04 DIAGNOSIS — J301 Allergic rhinitis due to pollen: Secondary | ICD-10-CM | POA: Diagnosis not present

## 2020-04-04 DIAGNOSIS — H9042 Sensorineural hearing loss, unilateral, left ear, with unrestricted hearing on the contralateral side: Secondary | ICD-10-CM | POA: Diagnosis not present

## 2020-04-04 DIAGNOSIS — J0100 Acute maxillary sinusitis, unspecified: Secondary | ICD-10-CM | POA: Diagnosis not present

## 2020-07-16 ENCOUNTER — Telehealth: Payer: BC Managed Care – PPO | Admitting: Family

## 2020-07-16 DIAGNOSIS — J111 Influenza due to unidentified influenza virus with other respiratory manifestations: Secondary | ICD-10-CM

## 2020-07-16 DIAGNOSIS — Z20828 Contact with and (suspected) exposure to other viral communicable diseases: Secondary | ICD-10-CM | POA: Diagnosis not present

## 2020-07-16 MED ORDER — OSELTAMIVIR PHOSPHATE 75 MG PO CAPS: 75.0000 mg | ORAL_CAPSULE | Freq: Two times a day (BID) | ORAL | 0 refills | Status: DC

## 2020-07-16 NOTE — Progress Notes (Signed)
E visit for Flu like symptoms   We are sorry that you are not feeling well.  Here is how we plan to help! Based on what you have shared with me it looks like you may have possible exposure to a virus that causes influenza.  Influenza or "the flu" is   an infection caused by a respiratory virus. The flu virus is highly contagious and persons who did not receive their yearly flu vaccination may "catch" the flu from close contact.  We have anti-viral medications to treat the viruses that cause this infection. They are not a "cure" and only shorten the course of the infection. These prescriptions are most effective when they are given within the first 2 days of "flu" symptoms. Antiviral medication are indicated if you have a high risk of complications from the flu. You should  also consider an antiviral medication if you are in close contact with someone who is at risk. These medications can help patients avoid complications from the flu  but have side effects that you should know. Possible side effects from Tamiflu or oseltamivir include nausea, vomiting, diarrhea, dizziness, headaches, eye redness, sleep problems or other respiratory symptoms. You should not take Tamiflu if you have an allergy to oseltamivir or any to the ingredients in Tamiflu.  Based upon your symptoms and potential risk factors I have prescribed Oseltamivir (Tamiflu).  It has been sent to your designated pharmacy.  You will take one 75 mg capsule orally twice a day for the next 5 days.  ANYONE WHO HAS FLU SYMPTOMS SHOULD: . Stay home. The flu is highly contagious and going out or to work exposes others! . Be sure to drink plenty of fluids. Water is fine as well as fruit juices, sodas and electrolyte beverages. You may want to stay away from caffeine or alcohol. If you are nauseated, try taking small sips of liquids. How do you know if you are getting enough fluid? Your urine should be a pale yellow or almost colorless. . Get  rest. . Taking a steamy shower or using a humidifier may help nasal congestion and ease sore throat pain. Using a saline nasal spray works much the same way. . Cough drops, hard candies and sore throat lozenges may ease your cough. . Line up a caregiver. Have someone check on you regularly.   GET HELP RIGHT AWAY IF: . You cannot keep down liquids or your medications. . You become short of breath . Your fell like you are going to pass out or loose consciousness. . Your symptoms persist after you have completed your treatment plan MAKE SURE YOU   Understand these instructions.  Will watch your condition.  Will get help right away if you are not doing well or get worse.  Your e-visit answers were reviewed by a board certified advanced clinical practitioner to complete your personal care plan.  Depending on the condition, your plan could have included both over the counter or prescription medications.  If there is a problem please reply  once you have received a response from your provider.  Your safety is important to us.  If you have drug allergies check your prescription carefully.    You can use MyChart to ask questions about today's visit, request a non-urgent call back, or ask for a work or school excuse for 24 hours related to this e-Visit. If it has been greater than 24 hours you will need to follow up with your provider, or enter a new   e-Visit to address those concerns.  You will get an e-mail in the next two days asking about your experience.  I hope that your e-visit has been valuable and will speed your recovery. Thank you for using e-visits.  Approximately 5 minutes was spent documenting and reviewing patient's chart.    

## 2020-07-24 ENCOUNTER — Ambulatory Visit: Payer: BC Managed Care – PPO | Admitting: Family Medicine

## 2020-07-25 ENCOUNTER — Ambulatory Visit (INDEPENDENT_AMBULATORY_CARE_PROVIDER_SITE_OTHER): Payer: BC Managed Care – PPO | Admitting: Family Medicine

## 2020-07-25 ENCOUNTER — Encounter: Payer: Self-pay | Admitting: Family Medicine

## 2020-07-25 ENCOUNTER — Other Ambulatory Visit: Payer: Self-pay

## 2020-07-25 VITALS — BP 124/80 | HR 88 | Temp 97.3°F | Resp 16 | Ht 61.0 in | Wt 133.0 lb

## 2020-07-25 DIAGNOSIS — Z720 Tobacco use: Secondary | ICD-10-CM

## 2020-07-25 DIAGNOSIS — Z1159 Encounter for screening for other viral diseases: Secondary | ICD-10-CM | POA: Diagnosis not present

## 2020-07-25 DIAGNOSIS — E7800 Pure hypercholesterolemia, unspecified: Secondary | ICD-10-CM | POA: Diagnosis not present

## 2020-07-25 DIAGNOSIS — R7309 Other abnormal glucose: Secondary | ICD-10-CM | POA: Diagnosis not present

## 2020-07-25 DIAGNOSIS — Z1231 Encounter for screening mammogram for malignant neoplasm of breast: Secondary | ICD-10-CM

## 2020-07-25 DIAGNOSIS — Z0000 Encounter for general adult medical examination without abnormal findings: Secondary | ICD-10-CM

## 2020-07-25 DIAGNOSIS — Z23 Encounter for immunization: Secondary | ICD-10-CM | POA: Diagnosis not present

## 2020-07-25 MED ORDER — VARENICLINE TARTRATE 0.5 MG X 11 & 1 MG X 42 PO MISC: ORAL | 0 refills | Status: DC

## 2020-07-25 NOTE — Patient Instructions (Addendum)
Thank you for coming to the office today.  Colonoscopy good for 10 years, last done 06/2016, next due 06/2026  No more pap smears  For Mammogram screening for breast cancer   Call the Imaging Center below anytime to schedule your own appointment now that order has been placed.  [facility] [address] [place], [place] [address] Phone: [phone]  Labs ordered, stay tuned for results.  ----  Varenicline - Chantix Dosing: Duration Dosage  Initial 3 days  0.5mg  daily  Days 4-7 0.5mg  twice daily  Day 8 through the end of therapy  1mg  twice daily   Prescribing Instructions: Use of "Chantix - Starting Month Pak" and "Chantix - Continuing Month Pak" provide easy to use blister packaging.  . Black Box Warning: Mood Change -"Serious neuropsychiatric events have been reported." Stop Drug and contact health care provider if patient. "agitation, hostility, depressed mood or changes in thinking or behavior that are not typical for the patient are observed, or if the patient develops suicidal ideation / behavior." Document potential mood change.  . Patients can continue to smoke while initiating varenicline. . A "target quit date" should be set on calendar approximately Day 8 to max Day 30 (of the treatment) - PREFERRED QUIT DATE - 1 to 2 weeks from start of medicine. . Advise to take this medication WITH food.  Minimizing the nausea (typically mild and transient).  If nausea occurs, consider dose reduction or temporary cessation of treatment.   . The treatment should be continued for 12 weeks.  An additional 12 weeks of therapy can be used at the prescribers discretion.   . Chantix can change the way people react to alcohol (decreased tolerance, increased drunkenness, unusual or aggressive behavior, memory loss) . Seizures occurred in patients that had no history of seizures and in patients that had a seizure disorder that had been  well controlled  . Discuss Cardiovascular Safety   Please schedule a Follow-up Appointment to: Return in about 1 year (around 07/25/2021) for 1 year Annual Physical AM apt fasting lab AFTER.  If you have any other questions or concerns, please feel free to call the office or send a message through MyChart. You may also schedule an earlier appointment if necessary.  Additionally, you may be receiving a survey about your experience at our office within a few days to 1 week by e-mail or mail. We value your feedback.  07/27/2021, DO [facility], [facility]

## 2020-07-25 NOTE — Progress Notes (Signed)
Subjective:    Patient ID: Tasha Simmons, female    DOB: March 20, 1971, 49 y.o.   MRN: [id number]  Tasha Simmons is a 48 y.o. female presenting on 07/25/2020 for Annual Exam   HPI   Here for Annual Physical and Lab Review.  HYPERLIPIDEMIA: - Reports no concerns. Last lipid panel 2019 Not taking any supplement or rx medication Lifestyle - Diet: limiting sodium, caffeine - Exercise: walking  Elevated BP without HTN Checks home BP 120-130 / 80-90 occasionally  Tobacco Abuse Previously quit for 6 months on chantix in past, ready to try again. Quarter pack 0.25 ppd smoker, 13 years Now ready to quit  Back Pain - resolved, had mild flare with sciatica recently now resolved with rest and heat.  Health Maintenance: UTD COVID Moderna vaccine, 2nd dose 04/19/20  Cervical CA Screening: S/p Hysterectomy 10/27/17, removal of cervix, Dr Amalia Hailey [facility], no further paps required.  Breast CA Screening: Due for mammogram screening. Last mammogram result 2019 see results below had possible fibroadenoma, ultrasound done, now due for routine screening. No known family history of breast cancer. Currently asymptomatic.  Colon CA Screening: Last Colonoscopy 06/28/16 (done by Dr Vicente Males [facility]), results with 1 polyp hyperplastic negative, good for 10 years. Currently asymptomatic. No known family history of colon CA. Good until 06/2026    Depression screen [facility] 2/9 07/25/2020 01/26/2019 03/20/2018  Decreased Interest 0 0 0  Down, Depressed, Hopeless 0 0 0  PHQ - 2 Score 0 0 0  Altered sleeping - - 0  Tired, decreased energy - - 0  Change in appetite - - 0  Feeling bad or failure about yourself  - - 0  Trouble concentrating - - 0  Moving slowly or fidgety/restless - - 0  Suicidal thoughts - - 0  PHQ-9 Score - - 0  Difficult doing work/chores - - Not difficult at all    Past Medical History:  Diagnosis Date  . Acid reflux   . Anemia   . Benign breast cyst in female    5 years ago.   Marland Kitchen Deafness in  left ear   . Difficult intubation    Very limited ROM in neck, large overbite. Grade III view with MIL 2 and cricoid pressure. Otherwise atraumatic.  . Duodenitis   . Heart murmur   . Hyperlipidemia   . Thrombocytosis   . Uterine fibroid    Past Surgical History:  Procedure Laterality Date  . BACK SURGERY     scoliosis  . BREAST BIOPSY Left 7 years ago   Benign no scar  . COLONOSCOPY WITH PROPOFOL N/A 06/28/2016   Procedure: COLONOSCOPY WITH PROPOFOL;  Surgeon: Jonathon Bellows, MD;  Location: [facility] ENDOSCOPY;  Service: Endoscopy;  Laterality: N/A;  . ESOPHAGOGASTRODUODENOSCOPY (EGD) WITH PROPOFOL N/A 06/28/2016   Procedure: ESOPHAGOGASTRODUODENOSCOPY (EGD) WITH PROPOFOL;  Surgeon: Jonathon Bellows, MD;  Location: [facility] ENDOSCOPY;  Service: Endoscopy;  Laterality: N/A;  . LAPAROSCOPIC ASSISTED VAGINAL HYSTERECTOMY N/A 10/27/2017   Procedure: LAPAROSCOPIC ASSISTED VAGINAL HYSTERECTOMY;  Surgeon: Harlin Heys, MD;  Location: [facility] ORS;  Service: Gynecology;  Laterality: N/A;   Social History   Socioeconomic History  . Marital status: Single    Spouse name: Not on file  . Number of children: Not on file  . Years of education: Not on file  . Highest education level: Not on file  Occupational History  . Not on file  Tobacco Use  . Smoking status: Current Every Day Smoker    Packs/day:  0.25    Years: 13.00    Pack years: 3.25  . Smokeless tobacco: Never Used  Vaping Use  . Vaping Use: Never used  Substance and Sexual Activity  . Alcohol use: No  . Drug use: No  . Sexual activity: Yes    Birth control/protection: Surgical  Other Topics Concern  . Not on file  Social History Narrative  . Not on file   Social Determinants of Health   Financial Resource Strain: Not on file  Food Insecurity: Not on file  Transportation Needs: Not on file  Physical Activity: Not on file  Stress: Not on file  Social Connections: Not on file  Intimate Partner Violence: Not on file   Family History   Problem Relation Age of Onset  . Hyperlipidemia Mother   . Hypertension Mother   . COPD Mother   . Heart disease Father   . Hyperlipidemia Father   . Heart disease Maternal Grandmother   . Thyroid disease Sister   . Breast cancer Neg Hx   . Colon cancer Neg Hx   . Cervical cancer Neg Hx    Current Outpatient Medications on File Prior to Visit  Medication Sig  . Multiple Vitamin (MULTIVITAMIN) tablet Take 1 tablet by mouth daily.   No current facility-administered medications on file prior to visit.    Review of Systems  Constitutional: Negative for activity change, appetite change, chills, diaphoresis, fatigue and fever.  HENT: Negative for congestion and hearing loss.   Eyes: Negative for visual disturbance.  Respiratory: Negative for cough, chest tightness, shortness of breath and wheezing.   Cardiovascular: Negative for chest pain, palpitations and leg swelling.  Gastrointestinal: Negative for abdominal pain, constipation, diarrhea, nausea and vomiting.  Genitourinary: Negative for dysuria, frequency and hematuria.  Musculoskeletal: Negative for arthralgias and neck pain.  Skin: Negative for rash.  Allergic/Immunologic: Negative for environmental allergies.  Neurological: Negative for dizziness, weakness, light-headedness, numbness and headaches.  Hematological: Negative for adenopathy.  Psychiatric/Behavioral: Negative for behavioral problems, dysphoric mood and sleep disturbance.   Per HPI unless specifically indicated above      Objective:    BP 124/80 (BP Location: Left Arm, Cuff Size: Normal)   Pulse 88   Temp (!) 97.3 F (36.3 C) (Temporal)   Resp 16   Ht 5\' 1"  (1.549 m)   Wt 133 lb (60.3 kg)   LMP 10/20/2017 (Exact Date)   SpO2 100%   BMI 25.13 kg/m   Wt Readings from Last 3 Encounters:  07/25/20 133 lb (60.3 kg)  04/15/19 135 lb 12.8 oz (61.6 kg)  08/07/18 134 lb 9.6 oz (61.1 kg)    Physical Exam Vitals and nursing note reviewed.  Constitutional:       General: She is not in acute distress.    Appearance: She is well-developed and well-nourished. She is not diaphoretic.     Comments: Well-appearing, comfortable, cooperative  HENT:     Head: Normocephalic and atraumatic.     Mouth/Throat:     Mouth: Oropharynx is clear and moist.  Eyes:     General:        Right eye: No discharge.        Left eye: No discharge.     Extraocular Movements: EOM normal.     Conjunctiva/sclera: Conjunctivae normal.     Pupils: Pupils are equal, round, and reactive to light.  Neck:     Thyroid: No thyromegaly.     Vascular: No carotid bruit.  Cardiovascular:  Rate and Rhythm: Normal rate and regular rhythm.     Pulses: Intact distal pulses.     Heart sounds: Normal heart sounds. No murmur heard.   Pulmonary:     Effort: Pulmonary effort is normal. No respiratory distress.     Breath sounds: Normal breath sounds. No wheezing or rales.  Abdominal:     General: Bowel sounds are normal. There is no distension.     Palpations: Abdomen is soft. There is no mass.     Tenderness: There is no abdominal tenderness.  Musculoskeletal:        General: No tenderness or edema. Normal range of motion.     Cervical back: Normal range of motion and neck supple.     Right lower leg: No edema.     Left lower leg: No edema.     Comments: Upper / Lower Extremities: - Normal muscle tone, strength bilateral upper extremities 5/5, lower extremities 5/5  Lymphadenopathy:     Cervical: No cervical adenopathy.  Skin:    General: Skin is warm and dry.     Findings: No erythema or rash.  Neurological:     Mental Status: She is alert and oriented to person, place, and time.     Comments: Distal sensation intact to light touch all extremities  Psychiatric:        Mood and Affect: Mood and affect normal.        Behavior: Behavior normal.     Comments: Well groomed, good eye contact, normal speech and thoughts      I have personally reviewed the radiology  report from Mammogram 06/09/18.   CLINICAL DATA:  Follow-up left breast probable fibroadenoma.  EXAM: DIGITAL DIAGNOSTIC BILATERAL MAMMOGRAM WITH CAD AND TOMO  ULTRASOUND LEFT BREAST  COMPARISON:  Previous exam(s).  ACR Breast Density Category c: The breast tissue is heterogeneously dense, which may obscure small masses.  FINDINGS: The previously demonstrated oval, circumscribed mass in the upper inner quadrant of the left breast with coarse calcifications appears somewhat smaller with increased coarseness of the calcifications. No findings elsewhere in either breast suspicious for malignancy.  Mammographic images were processed with CAD.  Targeted ultrasound is performed, showing a 1.7 x 1.5 x 1.2 cm oval, obliquely oriented, circumscribed, hypoechoic mass containing coarse calcifications in the 11 o'clock position of the left breast, 3 cm from the nipple. This has not changed significantly in size since 06/06/2017 and 09/01/2015.  IMPRESSION: 1. Benign left breast degenerated fibroadenoma. This does not need further follow-up. 2. No evidence of malignancy in either breast.  RECOMMENDATION: Bilateral screening mammogram in 1 year.  I have discussed the findings and recommendations with the patient. Results were also provided in writing at the conclusion of the visit. If applicable, a reminder letter will be sent to the patient regarding the next appointment.  BI-RADS CATEGORY  2: Benign.   Electronically Signed   By: Beckie Salts M.D.   On: 06/09/2018 14:46  Results for orders placed or performed in visit on 08/07/18  Urine Culture   Specimen: Urine  Result Value Ref Range   MICRO NUMBER: [id number]    SPECIMEN QUALITY: Adequate    Sample Source NOT GIVEN    STATUS: FINAL    ISOLATE 1: Escherichia coli (A)       Susceptibility   Escherichia coli - URINE CULTURE, REFLEX    AMOX/CLAVULANIC 4 Sensitive     AMPICILLIN 4 Sensitive      AMPICILLIN/SULBACTAM <=2 Sensitive  CEFAZOLIN* <=4 Not Reportable      * For infections other than uncomplicated UTIcaused by E. coli, K. pneumoniae or P. mirabilis:Cefazolin is resistant if MIC > or = 8 mcg/mL.(Distinguishing susceptible versus intermediatefor isolates with MIC < or = 4 mcg/mL requiresadditional testing.)For uncomplicated UTI caused by E. coli,K. pneumoniae or P. mirabilis: Cefazolin issusceptible if MIC <32 mcg/mL and predictssusceptible to the oral agents cefaclor, cefdinir,cefpodoxime, cefprozil, cefuroxime, cephalexinand loracarbef.    CEFEPIME <=1 Sensitive     CEFTRIAXONE <=1 Sensitive     CIPROFLOXACIN <=0.25 Sensitive     LEVOFLOXACIN <=0.12 Sensitive     ERTAPENEM <=0.5 Sensitive     GENTAMICIN <=1 Sensitive     IMIPENEM <=0.25 Sensitive     NITROFURANTOIN <=16 Sensitive     PIP/TAZO <=4 Sensitive     TOBRAMYCIN <=1 Sensitive     TRIMETH/SULFA* <=20 Sensitive      * For infections other than uncomplicated UTIcaused by E. coli, K. pneumoniae or P. mirabilis:Cefazolin is resistant if MIC > or = 8 mcg/mL.(Distinguishing susceptible versus intermediatefor isolates with MIC < or = 4 mcg/mL requiresadditional testing.)For uncomplicated UTI caused by E. coli,K. pneumoniae or P. mirabilis: Cefazolin issusceptible if MIC <32 mcg/mL and predictssusceptible to the oral agents cefaclor, cefdinir,cefpodoxime, cefprozil, cefuroxime, cephalexinand loracarbef.Legend:S = Susceptible  I = IntermediateR = Resistant  NS = Not susceptible* = Not tested  NR = Not reported**NN = See antimicrobic comments  POCT Urinalysis Dipstick  Result Value Ref Range   Color, UA dark    Clarity, UA clear    Glucose, UA Negative Negative   Bilirubin, UA Negative    Ketones, UA Negative    Spec Grav, UA 1.010 1.010 - 1.025   Blood, UA large    pH, UA 5.0 5.0 - 8.0   Protein, UA Negative Negative   Urobilinogen, UA 0.2 0.2 or 1.0 E.U./dL   Nitrite, UA positive    Leukocytes, UA Large (3+) (A)  Negative   Appearance     Odor        Assessment & Plan:   Problem List Items Addressed This Visit    Hyperlipidemia   Relevant Orders   Lipid panel   TSH    Other Visit Diagnoses    Annual physical exam    -  Primary   Relevant Orders   Hemoglobin A1c   CBC with Differential/Platelet   COMPLETE METABOLIC PANEL WITH GFR   Lipid panel   Need for hepatitis C screening test       Relevant Orders   Hepatitis C antibody   Needs flu shot       Relevant Orders   Flu Vaccine QUAD 36+ mos IM (Completed)   Encounter for screening mammogram for malignant neoplasm of breast       Relevant Orders   MM 3D SCREEN BREAST BILATERAL   Abnormal glucose       Relevant Orders   Hemoglobin A1c   Tobacco abuse       Relevant Medications   varenicline (CHANTIX PAK) 0.5 MG X 11 & 1 MG X 42 tablet      Updated Health Maintenance information Need update on covid card Next colonoscopy 06/2026 Ordered mammogram patient to schedule Not due for pap smears anymore Fasting labs drawn today Flu shot today Encouraged improvement to lifestyle with diet and exercise - Goal of weight loss  #Smoking Cessation Ready to quit  Plan: 1. Start Varenicline (Chantix) Day 1-3: 0.5mg  once daily WITH FOOD, Days  4-7: increase to 0.5mg  BID, then maintenance dose after 1 week: 1mg  BID for up to 12 weeks - Set quit date 1 week after start of medication, alternatively if unable to quit, then set goal reduce by 50% or more by 4 weeks, then another 50% reduction in 4 more weeks, and lastly quit after final 4 weeks (total 12 week therapy) - Optional additional 12 weeks if needed for maintenance - Counseling on side effects primarily nausea (take with food), headaches, insomnia, vivid dreams, mood instability, seizure, rare risk of suicidal ideation, if any serious agitation or acute depression severe mood changes need to stop med  Discussion today >5 minutes (<10 minutes) specifically on counseling on risks of  tobacco use, complications, treatment, smoking cessation.    Meds ordered this encounter  Medications  . varenicline (CHANTIX PAK) 0.5 MG X 11 & 1 MG X 42 tablet    Sig: Take one 0.5 mg tab by mouth once daily for 3 days, increase to one 0.5 mg twice daily for 4 days, then increase to one 1 mg twice daily.    Dispense:  53 tablet    Refill:  0     Follow up plan: Return in about 1 year (around 07/25/2021) for 1 year Annual Physical AM apt fasting lab AFTER.  07/27/2021, DO [facility] 07/25/2020, 8:13 AM

## 2020-07-26 LAB — CBC WITH DIFFERENTIAL/PLATELET
Absolute Monocytes: 679 cells/uL (ref 200–950)
Basophils Absolute: 61 cells/uL (ref 0–200)
Basophils Relative: 0.7 %
Eosinophils Absolute: 139 cells/uL (ref 15–500)
Eosinophils Relative: 1.6 %
HCT: 44.9 % (ref 35.0–45.0)
Hemoglobin: 14.9 g/dL (ref 11.7–15.5)
Lymphs Abs: 2697 cells/uL (ref 850–3900)
MCH: 29.9 pg (ref 27.0–33.0)
MCHC: 33.2 g/dL (ref 32.0–36.0)
MCV: 90 fL (ref 80.0–100.0)
MPV: 8.9 fL (ref 7.5–12.5)
Monocytes Relative: 7.8 %
Neutro Abs: 5124 cells/uL (ref 1500–7800)
Neutrophils Relative %: 58.9 %
Platelets: 401 10*3/uL — ABNORMAL HIGH (ref 140–400)
RBC: 4.99 10*6/uL (ref 3.80–5.10)
RDW: 13 % (ref 11.0–15.0)
Total Lymphocyte: 31 %
WBC: 8.7 10*3/uL (ref 3.8–10.8)

## 2020-07-26 LAB — COMPLETE METABOLIC PANEL WITH GFR
AG Ratio: 1.6 (calc) (ref 1.0–2.5)
ALT: 20 U/L (ref 6–29)
AST: 21 U/L (ref 10–35)
Albumin: 4.5 g/dL (ref 3.6–5.1)
Alkaline phosphatase (APISO): 62 U/L (ref 31–125)
BUN: 19 mg/dL (ref 7–25)
CO2: 26 mmol/L (ref 20–32)
Calcium: 9.7 mg/dL (ref 8.6–10.2)
Chloride: 105 mmol/L (ref 98–110)
Creat: 0.72 mg/dL (ref 0.50–1.10)
GFR, Est African American: 115 mL/min/{1.73_m2} (ref >=60)
GFR, Est Non African American: 99 mL/min/{1.73_m2} (ref >=60)
Globulin: 2.9 g/dL (calc) (ref 1.9–3.7)
Glucose, Bld: 82 mg/dL (ref 65–99)
Potassium: 4.5 mmol/L (ref 3.5–5.3)
Sodium: 139 mmol/L (ref 135–146)
Total Bilirubin: 0.3 mg/dL (ref 0.2–1.2)
Total Protein: 7.4 g/dL (ref 6.1–8.1)

## 2020-07-26 LAB — LIPID PANEL
Cholesterol: 235 mg/dL — ABNORMAL HIGH (ref <200)
HDL: 71 mg/dL (ref >=50)
LDL Cholesterol (Calc): 136 mg/dL (calc) — ABNORMAL HIGH
Non-HDL Cholesterol (Calc): 164 mg/dL (calc) — ABNORMAL HIGH (ref <130)
Total CHOL/HDL Ratio: 3.3 (calc) (ref <5.0)
Triglycerides: 150 mg/dL — ABNORMAL HIGH (ref <150)

## 2020-07-26 LAB — HEMOGLOBIN A1C
Hgb A1c MFr Bld: 5.1 % of total Hgb (ref <5.7)
Mean Plasma Glucose: 100 mg/dL
eAG (mmol/L): 5.5 mmol/L

## 2020-07-26 LAB — TSH: TSH: 1.64 mIU/L

## 2020-07-26 LAB — HEPATITIS C ANTIBODY
Hepatitis C Ab: NONREACTIVE
SIGNAL TO CUT-OFF: 0 (ref <1.00)

## 2020-08-29 ENCOUNTER — Ambulatory Visit (INDEPENDENT_AMBULATORY_CARE_PROVIDER_SITE_OTHER): Payer: BC Managed Care – PPO | Admitting: Unknown Physician Specialty

## 2020-08-29 ENCOUNTER — Other Ambulatory Visit: Payer: Self-pay

## 2020-08-29 VITALS — BP 158/96 | HR 80 | Ht 61.0 in | Wt 132.5 lb

## 2020-08-29 DIAGNOSIS — M545 Low back pain, unspecified: Secondary | ICD-10-CM | POA: Diagnosis not present

## 2020-08-29 MED ORDER — CYCLOBENZAPRINE HCL 10 MG PO TABS: 10.0000 mg | ORAL_TABLET | Freq: Three times a day (TID) | ORAL | 0 refills | Status: DC | PRN

## 2020-08-29 NOTE — Progress Notes (Signed)
BP (!) 158/96   Pulse 80   Ht 5\' 1"  (1.549 m)   Wt 132 lb 8 oz (60.1 kg)   LMP 10/20/2017 (Exact Date)   SpO2 100%   BMI 25.04 kg/m    Subjective:    Patient ID: Tasha Simmons, female    DOB: [birth year], 50 y.o.   MRN: [id number]  HPI: Tasha Simmons is a 50 y.o. female  Chief Complaint  Patient presents with  . Back Pain    Back pain associated with right leg   Back Pain This is a new problem. Episode onset: 1 month. The problem occurs intermittently. The problem is unchanged. The pain is present in the lumbar spine. The pain radiates to the left knee. The pain is worse during the day. Exacerbated by: walking around. Stiffness is present in the morning. Associated symptoms include leg pain and numbness. Pertinent negatives include no abdominal pain, bladder incontinence, bowel incontinence, fever, paresis, tingling, weakness or weight loss. Risk factors include pregnancy. Treatments tried: Tylenol.   Past Surgical History:  Procedure Laterality Date  . BACK SURGERY     scoliosis  . BREAST BIOPSY Left 7 years ago   Benign no scar  . COLONOSCOPY WITH PROPOFOL N/A 06/28/2016   Procedure: COLONOSCOPY WITH PROPOFOL;  Surgeon: Jonathon Bellows, MD;  Location: [facility] ENDOSCOPY;  Service: Endoscopy;  Laterality: N/A;  . ESOPHAGOGASTRODUODENOSCOPY (EGD) WITH PROPOFOL N/A 06/28/2016   Procedure: ESOPHAGOGASTRODUODENOSCOPY (EGD) WITH PROPOFOL;  Surgeon: Jonathon Bellows, MD;  Location: [facility] ENDOSCOPY;  Service: Endoscopy;  Laterality: N/A;  . LAPAROSCOPIC ASSISTED VAGINAL HYSTERECTOMY N/A 10/27/2017   Procedure: LAPAROSCOPIC ASSISTED VAGINAL HYSTERECTOMY;  Surgeon: Harlin Heys, MD;  Location: [facility] ORS;  Service: Gynecology;  Laterality: N/A;    Relevant past medical, surgical, family and social history reviewed and updated as indicated. Interim medical history since our last visit reviewed. Allergies and medications reviewed and updated.  Review of Systems  Constitutional: Negative for fever  and weight loss.  Gastrointestinal: Negative for abdominal pain and bowel incontinence.  Genitourinary: Negative for bladder incontinence.  Musculoskeletal: Positive for back pain.  Neurological: Positive for numbness. Negative for tingling and weakness.    Per HPI unless specifically indicated above     Objective:    BP (!) 158/96   Pulse 80   Ht 5\' 1"  (1.549 m)   Wt 132 lb 8 oz (60.1 kg)   LMP 10/20/2017 (Exact Date)   SpO2 100%   BMI 25.04 kg/m   Wt Readings from Last 3 Encounters:  08/29/20 132 lb 8 oz (60.1 kg)  07/25/20 133 lb (60.3 kg)  04/15/19 135 lb 12.8 oz (61.6 kg)    Physical Exam HENT:     Head: Normocephalic and atraumatic.     Nose: Nose normal.  Cardiovascular:     Rate and Rhythm: Normal rate and regular rhythm.  Pulmonary:     Effort: Pulmonary effort is normal.  Abdominal:     General: Abdomen is flat.  Musculoskeletal:     Cervical back: Normal range of motion.     Comments: Extensive scarring from scoliosis surgery. SLRs, strength, and sensation equal bilaterally.  No point tenderness  Skin:    General: Skin is warm and dry.  Neurological:     Mental Status: She is alert.  Psychiatric:        Mood and Affect: Mood normal.        Behavior: Behavior normal.        Thought Content: Thought  content normal.     .    Assessment & Plan:   Problem List Items Addressed This Visit   None   Visit Diagnoses    Acute midline low back pain without sciatica    -  Primary   Rx for Flexeril.  Caution drowsiness.  Condtinue OTC and heat or cold.  Refer to PT particularly with PSH of scoliosis.   Relevant Medications   cyclobenzaprine (FLEXERIL) 10 MG tablet   Other Relevant Orders   Ambulatory referral to Physical Therapy       Has been out of work intermittently due to her back pain.  She would like to have FMLA paperwork.  She will bring forms in.    Follow up plan: Return if continuing to need FMLA.

## 2020-09-14 ENCOUNTER — Telehealth: Payer: BC Managed Care – PPO | Admitting: Orthopedic Surgery

## 2020-09-14 DIAGNOSIS — J069 Acute upper respiratory infection, unspecified: Secondary | ICD-10-CM

## 2020-09-14 MED ORDER — BENZONATATE 100 MG PO CAPS: 100.0000 mg | ORAL_CAPSULE | Freq: Three times a day (TID) | ORAL | 0 refills | Status: DC | PRN

## 2020-09-14 MED ORDER — NAPROXEN 500 MG PO TABS: 500.0000 mg | ORAL_TABLET | Freq: Two times a day (BID) | ORAL | 0 refills | Status: DC

## 2020-09-14 NOTE — Progress Notes (Signed)
We are sorry you are not feeling well.  Here is how we plan to help!  Based on what you have shared with me, it looks like you may have a viral upper respiratory infection.  Upper respiratory infections are caused by a large number of viruses; however, rhinovirus is the most common cause.   THESE SYMPTOMS ARE THE SAME AS WHAT WE'RE SEEING WITH THE LATEST SURGE OF COVID. PLEASE GET TESTED AS SOON AS POSSIBLE.  Symptoms vary from person to person, with common symptoms including sore throat, cough, fatigue or lack of energy and feeling of general discomfort.  A low-grade fever of up to 100.4 may present, but is often uncommon.  Symptoms vary however, and are closely related to a person's age or underlying illnesses.  The most common symptoms associated with an upper respiratory infection are nasal discharge or congestion, cough, sneezing, headache and pressure in the ears and face.  These symptoms usually persist for about 3 to 10 days, but can last up to 2 weeks.  It is important to know that upper respiratory infections do not cause serious illness or complications in most cases.    Upper respiratory infections can be transmitted from person to person, with the most common method of transmission being a person's hands.  The virus is able to live on the skin and can infect other persons for up to 2 hours after direct contact.  Also, these can be transmitted when someone coughs or sneezes; thus, it is important to cover the mouth to reduce this risk.  To keep the spread of the illness at [place], good hand hygiene is very important.  This is an infection that is most likely caused by a virus. There are no specific treatments other than to help you with the symptoms until the infection runs its course.  We are sorry you are not feeling well.  Here is how we plan to help!   For nasal congestion, you may use an oral decongestants such as Mucinex D or if you have glaucoma or high blood pressure use plain Mucinex.   Saline nasal spray or nasal drops can help and can safely be used as often as needed for congestion. IT IS FINE TO USE VASELINE IN YOUR NOSE OR EVEN AN ANTIBIOTIC OINTMENT LIKE NEOSPORIN.  If you do not have a history of heart disease, hypertension, diabetes or thyroid disease, prostate/bladder issues or glaucoma, you may also use Sudafed to treat nasal congestion.  It is highly recommended that you consult with a pharmacist or your primary care physician to ensure this medication is safe for you to take.     If you have a cough, you may use cough suppressants such as Delsym and Robitussin.  If you have glaucoma or high blood pressure, you can also use Coricidin HBP.   For cough I have prescribed for you A prescription cough medication called Tessalon Perles 100 mg. You may take 1-2 capsules every 8 hours as needed for cough  If you have a sore or scratchy throat, use a saltwater gargle-  to  teaspoon of salt dissolved in a 4-ounce to 8-ounce glass of warm water.  Gargle the solution for approximately 15-30 seconds and then spit.  It is important not to swallow the solution.  You can also use throat lozenges/cough drops and Chloraseptic spray to help with throat pain or discomfort.  Warm or cold liquids can also be helpful in relieving throat pain.  For headache, pain or general  discomfort, I have prescribed Naproxen 500mg  twice daily.  Some authorities believe that zinc sprays or the use of Echinacea may shorten the course of your symptoms.   HOME CARE . Only take medications as instructed by your medical team. . Be sure to drink plenty of fluids. Water is fine as well as fruit juices, sodas and electrolyte beverages. You may want to stay away from caffeine or alcohol. If you are nauseated, try taking small sips of liquids. How do you know if you are getting enough fluid? Your urine should be a pale yellow or almost colorless. . Get rest. . Taking a steamy shower or using a humidifier may help  nasal congestion and ease sore throat pain. You can place a towel over your head and breathe in the steam from hot water coming from a faucet. . Using a saline nasal spray works much the same way. . Cough drops, hard candies and sore throat lozenges may ease your cough. . Avoid close contacts especially the very young and the elderly . Cover your mouth if you cough or sneeze . Always remember to wash your hands.   GET HELP RIGHT AWAY IF: . You develop worsening fever. . If your symptoms do not improve within 10 days . You develop yellow or green discharge from your nose over 3 days. . You have coughing fits . You develop a severe head ache or visual changes. . You develop shortness of breath, difficulty breathing or start having chest pain . Your symptoms persist after you have completed your treatment plan  MAKE SURE YOU   Understand these instructions.  Will watch your condition.  Will get help right away if you are not doing well or get worse.  Your e-visit answers were reviewed by a board certified advanced clinical practitioner to complete your personal care plan. Depending upon the condition, your plan could have included both over the counter or prescription medications. Please review your pharmacy choice. If there is a problem, you may call our nursing hot line at and have the prescription routed to another pharmacy. Your safety is important to [place]. If you have drug allergies check your prescription carefully.   You can use MyChart to ask questions about today's visit, request a non-urgent call back, or ask for a work or school excuse for 24 hours related to this e-Visit. If it has been greater than 24 hours you will need to follow up with your provider, or enter a new e-Visit to address those concerns. You will get an e-mail in the next two days asking about your experience.  I hope that your e-visit has been valuable and will speed your recovery. Thank you for using  e-visits.   Greater than 5 minutes, yet less than 10 minutes of time have been spent researching, coordinating and implementing care for this patient today.

## 2020-09-15 DIAGNOSIS — H938X1 Other specified disorders of right ear: Secondary | ICD-10-CM | POA: Diagnosis not present

## 2020-09-29 DIAGNOSIS — H6982 Other specified disorders of Eustachian tube, left ear: Secondary | ICD-10-CM | POA: Diagnosis not present

## 2020-09-29 DIAGNOSIS — H9042 Sensorineural hearing loss, unilateral, left ear, with unrestricted hearing on the contralateral side: Secondary | ICD-10-CM | POA: Diagnosis not present

## 2020-10-31 ENCOUNTER — Telehealth: Payer: Self-pay

## 2020-10-31 NOTE — Telephone Encounter (Signed)
I called and left a message on the patient vm to notify her that Tasha Simmons would like to see her for a face-to-face visit to finish up her FMLA.. No answer

## 2020-11-07 ENCOUNTER — Encounter: Payer: Self-pay | Admitting: Unknown Physician Specialty

## 2020-11-07 ENCOUNTER — Ambulatory Visit (INDEPENDENT_AMBULATORY_CARE_PROVIDER_SITE_OTHER): Payer: BC Managed Care – PPO | Admitting: Unknown Physician Specialty

## 2020-11-07 ENCOUNTER — Other Ambulatory Visit: Payer: Self-pay

## 2020-11-07 DIAGNOSIS — G8929 Other chronic pain: Secondary | ICD-10-CM

## 2020-11-07 DIAGNOSIS — M545 Low back pain, unspecified: Secondary | ICD-10-CM | POA: Diagnosis not present

## 2020-11-07 DIAGNOSIS — M549 Dorsalgia, unspecified: Secondary | ICD-10-CM | POA: Insufficient documentation

## 2020-11-07 NOTE — Telephone Encounter (Signed)
Pt has an appt with cheryl today at 3 pm

## 2020-11-07 NOTE — Progress Notes (Signed)
BP 129/85 (BP Location: Left Arm, Patient Position: Sitting, Cuff Size: Normal)   Pulse 85   Temp 97.7 F (36.5 C) (Temporal)   Ht 5\' 1"  (1.549 m)   Wt 129 lb 12.8 oz (58.9 kg)   LMP 10/20/2017 (Exact Date)   SpO2 100%   BMI 24.53 kg/m    Subjective:    Patient ID: Tasha Simmons, female    DOB: Sep 21, 1970, 50 y.o.   MRN: [id number]  HPI: Tasha Simmons is a 50 y.o. female  Chief Complaint  Patient presents with  . Back Pain    Continues to have the pain that comes and go, it flares within a week normally. Pt states flexeril has helped. Pt would like FMLA paperwork.   Pt would like FMLA form filled out due to intermittent back pain.  See last note when I saw her on 2/1.  She states she is doing well now.  But she needs to take a day off about every 2 weeks for a flare.  She usually requires 1 day off at a time.  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    States the Flexeril worked well but did not go to PT as it "got better."  I've reviewed the chart and I don't see any other visits for back pain.  Pt is not seeing another provider.    Relevant past medical, surgical, family and social history reviewed and updated as indicated. Interim medical history since our last visit reviewed. Allergies and medications reviewed and updated.  Review of Systems  Per HPI unless specifically indicated above     Objective:    BP 129/85 (BP Location: Left Arm, Patient Position: Sitting, Cuff Size: Normal)   Pulse 85   Temp 97.7 F (36.5 C) (Temporal)   Ht 5\' 1"  (1.549 m)   Wt 129 lb 12.8 oz (58.9 kg)   LMP 10/20/2017 (Exact Date)   SpO2 100%   BMI 24.53 kg/m   Wt Readings from Last 3 Encounters:  11/07/20 129 lb 12.8 oz (58.9 kg)  08/29/20 132 lb 8 oz (60.1 kg)  07/25/20 133 lb (60.3 kg)    Physical Exam Constitutional:      General: She is not in acute distress.    Appearance: Normal appearance. She is well-developed.  HENT:      Head: Normocephalic and atraumatic.  Eyes:     General: Lids are normal. No scleral icterus.       Right eye: No discharge.        Left eye: No discharge.     Conjunctiva/sclera: Conjunctivae normal.  Cardiovascular:     Rate and Rhythm: Normal rate.  Pulmonary:     Effort: Pulmonary effort is normal.  Abdominal:     Palpations: There is no hepatomegaly or splenomegaly.  Musculoskeletal:        General: Normal range of motion.  Skin:    Coloration: Skin is not pale.     Findings: No rash.  Neurological:     Mental Status: She is alert and oriented to person, place, and time.  Psychiatric:        Behavior: Behavior normal.        Thought Content: Thought content normal.        Judgment: Judgment normal.     Results for orders placed or performed in visit on 07/25/20  Hemoglobin A1c  Result Value Ref Range   Hgb A1c MFr  Bld 5.1 <5.7 % of total Hgb   Mean Plasma Glucose 100 mg/dL   eAG (mmol/L) 5.5 mmol/L  CBC with Differential/Platelet  Result Value Ref Range   WBC 8.7 3.8 - 10.8 Thousand/uL   RBC 4.99 3.80 - 5.10 Million/uL   Hemoglobin 14.9 11.7 - 15.5 g/dL   HCT 44.9 35.0 - 45.0 %   MCV 90.0 80.0 - 100.0 fL   MCH 29.9 27.0 - 33.0 pg   MCHC 33.2 32.0 - 36.0 g/dL   RDW 13.0 11.0 - 15.0 %   Platelets 401 (H) 140 - 400 Thousand/uL   MPV 8.9 7.5 - 12.5 fL   Neutro Abs 5,124 1,500 - 7,800 cells/uL   Lymphs Abs 2,697 850 - 3,900 cells/uL   Absolute Monocytes 679 200 - 950 cells/uL   Eosinophils Absolute 139 15 - 500 cells/uL   Basophils Absolute 61 0 - 200 cells/uL   Neutrophils Relative % 58.9 %   Total Lymphocyte 31.0 %   Monocytes Relative 7.8 %   Eosinophils Relative 1.6 %   Basophils Relative 0.7 %  COMPLETE METABOLIC PANEL WITH GFR  Result Value Ref Range   Glucose, Bld 82 65 - 99 mg/dL   BUN 19 7 - 25 mg/dL   Creat 0.72 0.50 - 1.10 mg/dL   GFR, Est Non African American 99 > OR = 60 mL/min/1.13m2   GFR, Est African American 115 > OR = 60 mL/min/1.77m2    BUN/Creatinine Ratio NOT APPLICABLE 6 - 22 (calc)   Sodium 139 135 - 146 mmol/L   Potassium 4.5 3.5 - 5.3 mmol/L   Chloride 105 98 - 110 mmol/L   CO2 26 20 - 32 mmol/L   Calcium 9.7 8.6 - 10.2 mg/dL   Total Protein 7.4 6.1 - 8.1 g/dL   Albumin 4.5 3.6 - 5.1 g/dL   Globulin 2.9 1.9 - 3.7 g/dL (calc)   AG Ratio 1.6 1.0 - 2.5 (calc)   Total Bilirubin 0.3 0.2 - 1.2 mg/dL   Alkaline phosphatase (APISO) 62 31 - 125 U/L   AST 21 10 - 35 U/L   ALT 20 6 - 29 U/L  Lipid panel  Result Value Ref Range   Cholesterol 235 (H) <200 mg/dL   HDL 71 > OR = 50 mg/dL   Triglycerides 150 (H) <150 mg/dL   LDL Cholesterol (Calc) 136 (H) mg/dL (calc)   Total CHOL/HDL Ratio 3.3 <5.0 (calc)   Non-HDL Cholesterol (Calc) 164 (H) <130 mg/dL (calc)  TSH  Result Value Ref Range   TSH 1.64 mIU/L  Hepatitis C antibody  Result Value Ref Range   Hepatitis C Ab NON-REACTIVE NON-REACTI   SIGNAL TO CUT-OFF 0.00 <1.00      Assessment & Plan:   Problem List Items Addressed This Visit      Unprioritized   Back pain    FMLA filled out but we need more frequent visits and PT therapy as this is now becoming a persistent problem requiring frequent time off of work.  Will send in another referral to PT and will need to follow up here with progress.  Discussed to f/u with a back therapy plan with PT.        Relevant Orders   Ambulatory referral to Physical Therapy       Follow up plan: Return in about 6 weeks (around 12/19/2020).

## 2020-11-07 NOTE — Assessment & Plan Note (Signed)
FMLA filled out but we need more frequent visits and PT therapy as this is now becoming a persistent problem requiring frequent time off of work.  Will send in another referral to PT and will need to follow up here with progress.  Discussed to f/u with a back therapy plan with PT.

## 2020-11-22 DIAGNOSIS — L7622 Postprocedural hemorrhage and hematoma of skin and subcutaneous tissue following other procedure: Secondary | ICD-10-CM | POA: Diagnosis not present

## 2021-06-01 ENCOUNTER — Telehealth: Payer: Self-pay

## 2021-06-01 NOTE — Telephone Encounter (Signed)
I attempted to contact the patient, no answer. I left a detail message on her voicemail.  °

## 2021-06-01 NOTE — Telephone Encounter (Signed)
Copied from [place] [id number]. Topic: General - Other >> Jun 01, 2021  8:33 AM Leward Quan A wrote: Reason for CRM: Patient called in asking to see Dr Raliegh Ip today if possible say that she have a knot in the arch of her right foot that is very painful comes and goes and is hurting today making walking very hard. Please call patient at  Ph#  [phone]

## 2021-07-18 ENCOUNTER — Telehealth: Payer: BC Managed Care – PPO | Admitting: Family

## 2021-07-18 DIAGNOSIS — U071 COVID-19: Secondary | ICD-10-CM | POA: Diagnosis not present

## 2021-07-18 MED ORDER — ALBUTEROL SULFATE HFA 108 (90 BASE) MCG/ACT IN AERS: 2.0000 | INHALATION_SPRAY | Freq: Four times a day (QID) | RESPIRATORY_TRACT | 0 refills | Status: DC | PRN

## 2021-07-18 MED ORDER — FLUTICASONE PROPIONATE 50 MCG/ACT NA SUSP: 2.0000 | Freq: Every day | NASAL | 6 refills | Status: DC

## 2021-07-18 MED ORDER — BENZONATATE 100 MG PO CAPS: 100.0000 mg | ORAL_CAPSULE | Freq: Three times a day (TID) | ORAL | 0 refills | Status: DC | PRN

## 2021-07-18 NOTE — Progress Notes (Signed)
E-Visit for Corona Virus Screening  Your current symptoms could be consistent with the coronavirus.  Many health care providers can now test patients at their office but not all are.  [facility] has multiple testing sites. For information on our [facility] testing locations and hours go to [email or web address]  We are enrolling you in our [facility] for [facility] . Daily you will receive a questionnaire within the [facility] website. Our COVID 19 response team will be monitoring your responses daily.  Testing Information: The COVID-19 Community Testing sites are testing BY APPOINTMENT ONLY.  You can schedule online at [email or web address]  If you do not have access to a smart phone or computer you may call [phone] for an appointment.   Additional testing sites in the Community:  For [facility] Testing sites in [place]  [email or web address]  For Pop-up testing sites in [place]  [email or web address]  For [facility] [email or web address]  For [facility] testing in [place] and [employer] [email or web address]  For [facility] testing in [place]   [email or web address]  For  more information about community testing call [phone]   Please quarantine yourself while awaiting your test results. Please stay home for a minimum of 10 days from the first day of illness with improving symptoms and you have had 24 hours of no fever (without the use of Tylenol (Acetaminophen) Motrin (Ibuprofen) or any fever reducing medication).  Also - Do not get tested prior to returning to work because once you have had a positive test the test can stay positive for  more than a month in some cases.   You should wear a mask or cloth face covering over your nose and mouth if you must be around other people or animals, including pets (even at home). Try to stay at least 6 feet away from other people. This will protect the people around you.  Please continue good preventive care measures, including:  frequent hand-washing, avoid touching your face, cover coughs/sneezes, stay out of crowds and keep a 6 foot distance from others.  COVID-19 is a respiratory illness with symptoms that are similar to the flu. Symptoms are typically mild to moderate, but there have been cases of severe illness and death due to the virus.   The following symptoms may appear 2-14 days after exposure: Fever Cough Shortness of breath or difficulty breathing Chills Repeated shaking with chills Muscle pain Headache Sore throat New loss of taste or smell Fatigue Congestion or runny nose Nausea or vomiting Diarrhea  Go to the nearest hospital ED for assessment if fever/cough/breathlessness are severe or illness seems like a threat to life.  It is vitally important that if you feel that you have an infection such as this virus or any other virus that you stay home and away from places where you may spread it to others.  You should avoid contact with people age 9 and older.   You can use medication such as prescription cough medication called Tessalon Perles 100 mg. You may take 1-2 capsules every 8 hours as needed for cough,  prescription inhaler called Albuterol MDI 90 mcg /actuation 2 puffs every 4 hours as needed for shortness of breath, wheezing, cough, and prescription for Fluticasone nasal spray 2 sprays in each nostril one time per day  You may also take acetaminophen (Tylenol) as needed for fever.  Reduce your risk of any infection by using the same precautions used for avoiding the common cold or flu:  Wash your hands often  with soap and warm water for at least 20 seconds.  If  soap and water are not readily available, use an alcohol-based hand sanitizer with at least 60% alcohol.  If coughing or sneezing, cover your mouth and nose by coughing or sneezing into the elbow areas of your shirt or coat, into a tissue or into your sleeve (not your hands). Avoid shaking hands with others and consider head nods or verbal greetings only. Avoid touching your eyes, nose, or mouth with unwashed hands.  Avoid close contact with people who are sick. Avoid places or events with large numbers of people in one location, like concerts or sporting events. Carefully consider travel plans you have or are making. If you are planning any travel outside or inside the [place], visit the CDC's Travelers' Health webpage for the latest health notices. If you have some symptoms but not all symptoms, continue to monitor at home and seek medical attention if your symptoms worsen. If you are having a medical emergency, call 911.  HOME CARE Only take medications as instructed by your medical team. Drink plenty of fluids and get plenty of rest. A steam or ultrasonic humidifier can help if you have congestion.   GET HELP RIGHT AWAY IF YOU HAVE EMERGENCY WARNING SIGNS** FOR COVID-19. If you or someone is showing any of these signs seek emergency medical care immediately. Call 911 or proceed to your closest emergency facility if: You develop worsening high fever. Trouble breathing Bluish lips or face Persistent pain or pressure in the chest New confusion Inability to wake or stay awake You cough up blood. Your symptoms become more severe  **This list is not all possible symptoms. Contact your medical provider for any symptoms that are sever or concerning to you.  MAKE SURE YOU  Understand these instructions. Will watch your condition. Will get help right away if you are not doing well or get worse.  Your e-visit answers were reviewed by a board certified advanced clinical practitioner to complete  your personal care plan.  Depending on the condition, your plan could have included both over the counter or prescription medications.  If there is a problem please reply once you have received a response from your provider.  Your safety is important to [place].  If you have drug allergies check your prescription carefully.    You can use MyChart to ask questions about today's visit, request a non-urgent call back, or ask for a work or school excuse for 24 hours related to this e-Visit. If it has been greater than 24 hours you will need to follow up with your provider, or enter a new e-Visit to address those concerns. You will get an e-mail in the next two days asking about your experience.  I hope that your e-visit has been valuable and will speed your recovery. Thank you for using e-visits.  Approximately 5 minutes was spent documenting and reviewing patient's chart.

## 2021-08-13 ENCOUNTER — Telehealth: Payer: BC Managed Care – PPO | Admitting: Physician Assistant

## 2021-08-13 DIAGNOSIS — J069 Acute upper respiratory infection, unspecified: Secondary | ICD-10-CM

## 2021-08-13 MED ORDER — GUAIFENESIN ER 600 MG PO TB12
600.0000 mg | ORAL_TABLET | Freq: Two times a day (BID) | ORAL | 0 refills | Status: DC
Start: 2021-08-13 — End: 2021-09-03

## 2021-08-13 MED ORDER — PHENOL 1.4 % MT LIQD: 1.0000 | OROMUCOSAL | 0 refills | Status: DC | PRN

## 2021-08-13 NOTE — Progress Notes (Signed)
E-Visit for Upper Respiratory Infection   We are sorry you are not feeling well.  Here is how we plan to help!  Based on what you have shared with me, it looks like you may have a viral upper respiratory infection.  Upper respiratory infections are caused by a large number of viruses; however, rhinovirus is the most common cause.   Symptoms vary from person to person, with common symptoms including sore throat, cough, fatigue or lack of energy and feeling of general discomfort.  A low-grade fever of up to 100.4 may present, but is often uncommon.  Symptoms vary however, and are closely related to a person's age or underlying illnesses.  The most common symptoms associated with an upper respiratory infection are nasal discharge or congestion, cough, sneezing, headache and pressure in the ears and face.  These symptoms usually persist for about 3 to 10 days, but can last up to 2 weeks.  It is important to know that upper respiratory infections do not cause serious illness or complications in most cases.    Upper respiratory infections can be transmitted from person to person, with the most common method of transmission being a person's hands.  The virus is able to live on the skin and can infect other persons for up to 2 hours after direct contact.  Also, these can be transmitted when someone coughs or sneezes; thus, it is important to cover the mouth to reduce this risk.  To keep the spread of the illness at [place], good hand hygiene is very important.  This is an infection that is most likely caused by a virus. There are no specific treatments other than to help you with the symptoms until the infection runs its course.  We are sorry you are not feeling well.  Here is how we plan to help!   For nasal congestion, you may use saline nasal spray or nasal drops can help and can safely be used as often as needed for congestion.  For your congestion, I have prescribed Mucinex to take twice daily. You should  continue using your Fluticasone nasal spray for your symptoms as well.   If you do not have a history of heart disease, hypertension, diabetes or thyroid disease, prostate/bladder issues or glaucoma, you may also use Sudafed to treat nasal congestion.  It is highly recommended that you consult with a pharmacist or your primary care physician to ensure this medication is safe for you to take.     If you have a cough, you may use cough suppressants such as Delsym and Robitussin.  If you have glaucoma or high blood pressure, you can also use Coricidin HBP.     If you have a sore or scratchy throat, use a saltwater gargle-  to  teaspoon of salt dissolved in a 4-ounce to 8-ounce glass of warm water.  Gargle the solution for approximately 15-30 seconds and then spit.  It is important not to swallow the solution.  You can also use throat lozenges/cough drops and Chloraseptic spray to help with throat pain or discomfort. I have written a prescription for this for you as well. Warm or cold liquids can also be helpful in relieving throat pain.  For headache, pain or general discomfort, you can use Ibuprofen or Tylenol as directed.   Some authorities believe that zinc sprays or the use of Echinacea may shorten the course of your symptoms.   HOME CARE Only take medications as instructed by your medical team. Be  sure to drink plenty of fluids. Water is fine as well as fruit juices, sodas and electrolyte beverages. You may want to stay away from caffeine or alcohol. If you are nauseated, try taking small sips of liquids. How do you know if you are getting enough fluid? Your urine should be a pale yellow or almost colorless. Get rest. Taking a steamy shower or using a humidifier may help nasal congestion and ease sore throat pain. You can place a towel over your head and breathe in the steam from hot water coming from a faucet. Using a saline nasal spray works much the same way. Cough drops, hard candies and  sore throat lozenges may ease your cough. Avoid close contacts especially the very young and the elderly Cover your mouth if you cough or sneeze Always remember to wash your hands.   GET HELP RIGHT AWAY IF: You develop worsening fever. If your symptoms do not improve within 10 days You develop yellow or green discharge from your nose over 3 days. You have coughing fits You develop a severe head ache or visual changes. You develop shortness of breath, difficulty breathing or start having chest pain Your symptoms persist after you have completed your treatment plan  MAKE SURE YOU  Understand these instructions. Will watch your condition. Will get help right away if you are not doing well or get worse.  Thank you for choosing an e-visit.  Your e-visit answers were reviewed by a board certified advanced clinical practitioner to complete your personal care plan. Depending upon the condition, your plan could have included both over the counter or prescription medications.  Please review your pharmacy choice. Make sure the pharmacy is open so you can pick up prescription now. If there is a problem, you may contact your provider through [employer] and have the prescription routed to another pharmacy.  Your safety is important to [place]. If you have drug allergies check your prescription carefully.   For the next 24 hours you can use MyChart to ask questions about today's visit, request a non-urgent call back, or ask for a work or school excuse. You will get an email in the next two days asking about your experience. I hope that your e-visit has been valuable and will speed your recovery.  Approximately 5 minutes was spent documenting and reviewing patient's chart.

## 2021-08-27 ENCOUNTER — Other Ambulatory Visit: Payer: BC Managed Care – PPO

## 2021-08-28 ENCOUNTER — Other Ambulatory Visit: Payer: Self-pay | Admitting: Family Medicine

## 2021-08-28 ENCOUNTER — Other Ambulatory Visit: Payer: Self-pay

## 2021-08-28 ENCOUNTER — Other Ambulatory Visit: Payer: BC Managed Care – PPO

## 2021-08-28 DIAGNOSIS — E7800 Pure hypercholesterolemia, unspecified: Secondary | ICD-10-CM

## 2021-08-28 DIAGNOSIS — R7309 Other abnormal glucose: Secondary | ICD-10-CM | POA: Diagnosis not present

## 2021-08-28 DIAGNOSIS — Z0000 Encounter for general adult medical examination without abnormal findings: Secondary | ICD-10-CM

## 2021-08-28 DIAGNOSIS — D508 Other iron deficiency anemias: Secondary | ICD-10-CM

## 2021-08-29 LAB — COMPLETE METABOLIC PANEL WITH GFR
AG Ratio: 1.6 (calc) (ref 1.0–2.5)
ALT: 19 U/L (ref 6–29)
AST: 17 U/L (ref 10–35)
Albumin: 4.4 g/dL (ref 3.6–5.1)
Alkaline phosphatase (APISO): 61 U/L (ref 31–125)
BUN: 13 mg/dL (ref 7–25)
CO2: 26 mmol/L (ref 20–32)
Calcium: 9.5 mg/dL (ref 8.6–10.2)
Chloride: 104 mmol/L (ref 98–110)
Creat: 0.57 mg/dL (ref 0.50–0.99)
Globulin: 2.7 g/dL (calc) (ref 1.9–3.7)
Glucose, Bld: 82 mg/dL (ref 65–99)
Potassium: 4 mmol/L (ref 3.5–5.3)
Sodium: 138 mmol/L (ref 135–146)
Total Bilirubin: 0.7 mg/dL (ref 0.2–1.2)
Total Protein: 7.1 g/dL (ref 6.1–8.1)
eGFR: 111 mL/min/{1.73_m2} (ref >=60)

## 2021-08-29 LAB — CBC WITH DIFFERENTIAL/PLATELET
Absolute Monocytes: 659 cells/uL (ref 200–950)
Basophils Absolute: 62 cells/uL (ref 0–200)
Basophils Relative: 0.7 %
Eosinophils Absolute: 134 cells/uL (ref 15–500)
Eosinophils Relative: 1.5 %
HCT: 43.6 % (ref 35.0–45.0)
Hemoglobin: 14.8 g/dL (ref 11.7–15.5)
Lymphs Abs: 3302 cells/uL (ref 850–3900)
MCH: 29.8 pg (ref 27.0–33.0)
MCHC: 33.9 g/dL (ref 32.0–36.0)
MCV: 87.9 fL (ref 80.0–100.0)
MPV: 8.8 fL (ref 7.5–12.5)
Monocytes Relative: 7.4 %
Neutro Abs: 4744 cells/uL (ref 1500–7800)
Neutrophils Relative %: 53.3 %
Platelets: 405 10*3/uL — ABNORMAL HIGH (ref 140–400)
RBC: 4.96 10*6/uL (ref 3.80–5.10)
RDW: 12.4 % (ref 11.0–15.0)
Total Lymphocyte: 37.1 %
WBC: 8.9 10*3/uL (ref 3.8–10.8)

## 2021-08-29 LAB — TSH: TSH: 0.97 mIU/L

## 2021-08-29 LAB — LIPID PANEL
Cholesterol: 256 mg/dL — ABNORMAL HIGH (ref <200)
HDL: 77 mg/dL (ref >=50)
LDL Cholesterol (Calc): 154 mg/dL (calc) — ABNORMAL HIGH
Non-HDL Cholesterol (Calc): 179 mg/dL (calc) — ABNORMAL HIGH (ref <130)
Total CHOL/HDL Ratio: 3.3 (calc) (ref <5.0)
Triglycerides: 127 mg/dL (ref <150)

## 2021-08-29 LAB — HEMOGLOBIN A1C
Hgb A1c MFr Bld: 5.2 % of total Hgb (ref <5.7)
Mean Plasma Glucose: 103 mg/dL
eAG (mmol/L): 5.7 mmol/L

## 2021-08-30 ENCOUNTER — Other Ambulatory Visit: Payer: BC Managed Care – PPO

## 2021-09-03 ENCOUNTER — Other Ambulatory Visit: Payer: Self-pay

## 2021-09-03 ENCOUNTER — Encounter: Payer: Self-pay | Admitting: Family Medicine

## 2021-09-03 ENCOUNTER — Ambulatory Visit (INDEPENDENT_AMBULATORY_CARE_PROVIDER_SITE_OTHER): Payer: BC Managed Care – PPO | Admitting: Family Medicine

## 2021-09-03 VITALS — BP 128/82 | HR 83 | Ht 61.0 in | Wt 124.4 lb

## 2021-09-03 DIAGNOSIS — Z0000 Encounter for general adult medical examination without abnormal findings: Secondary | ICD-10-CM

## 2021-09-03 DIAGNOSIS — J0110 Acute frontal sinusitis, unspecified: Secondary | ICD-10-CM

## 2021-09-03 DIAGNOSIS — Z1231 Encounter for screening mammogram for malignant neoplasm of breast: Secondary | ICD-10-CM

## 2021-09-03 DIAGNOSIS — F4321 Adjustment disorder with depressed mood: Secondary | ICD-10-CM

## 2021-09-03 DIAGNOSIS — Z720 Tobacco use: Secondary | ICD-10-CM

## 2021-09-03 DIAGNOSIS — E7800 Pure hypercholesterolemia, unspecified: Secondary | ICD-10-CM | POA: Diagnosis not present

## 2021-09-03 DIAGNOSIS — F5104 Psychophysiologic insomnia: Secondary | ICD-10-CM

## 2021-09-03 MED ORDER — BUPROPION HCL ER (SR) 150 MG PO TB12: 150.0000 mg | ORAL_TABLET | Freq: Two times a day (BID) | ORAL | 2 refills | Status: DC

## 2021-09-03 MED ORDER — AMOXICILLIN-POT CLAVULANATE 875-125 MG PO TABS: 1.0000 | ORAL_TABLET | Freq: Two times a day (BID) | ORAL | 0 refills | Status: DC

## 2021-09-03 NOTE — Progress Notes (Signed)
Subjective:    Patient ID: Tasha Simmons, female    DOB: November 16, 1970, 51 y.o.   MRN: [id number]  Tasha Simmons is a 51 y.o. female presenting on 09/03/2021 for Annual Exam   HPI  Here for Annual Physical and Lab Review.   HYPERLIPIDEMIA: Elevated LDL 154, similar to 3 years ago, higher from last year. Not taking any supplement or rx medication Lifestyle - Diet: limiting sodium, caffeine - Exercise: walking   Elevated BP without HTN Normal range now.   Tobacco Abuse Previously quit for 6 months on chantix in past, ready to try again. Quarter pack 0.25 ppd smoker, 13 years Now ready to quit again Tried Chantix once more last time unsuccessful Interested in Wellbutrin   Back Pain - resolved, had mild flare with sciatica recently now resolved with rest and heat.   Adjustment Disorder Depressed mood Following recent separation from husband recently Poor sleep at times, difficulty sleep maintenance, takes OTC sleepaid  Additional history  Sinusitis Reports recent sinus pressure drainage congestion, has inner ear infection by report feels dizziness. vertigo   Health Maintenance: UTD COVID Moderna vaccine, 2nd dose 04/19/20   Cervical CA Screening: S/p Hysterectomy 10/27/17, removal of cervix, Dr Amalia Hailey G[facility], no further paps required.   Breast CA Screening: Due for mammogram screening. Last mammogram result 2019 see results below had possible fibroadenoma, ultrasound done, now due for routine screening. No known family history of breast cancer. Currently asymptomatic.   Colon CA Screening: Last Colonoscopy 06/28/16 (done by Dr Vicente Males [facility]), results with 1 polyp hyperplastic negative, good for 10 years. Currently asymptomatic. No known family history of colon CA. Good until 06/2026   Depression screen [facility] 2/9 09/03/2021 07/25/2020 01/26/2019  Decreased Interest 1 0 0  Down, Depressed, Hopeless 2 0 0  PHQ - 2 Score 3 0 0  Altered sleeping 3 - -  Tired, decreased energy 1 - -   Change in appetite 3 - -  Feeling bad or failure about yourself  1 - -  Trouble concentrating 1 - -  Moving slowly or fidgety/restless 0 - -  Suicidal thoughts 0 - -  PHQ-9 Score 12 - -  Difficult doing work/chores Not difficult at all - -    Past Medical History:  Diagnosis Date   Acid reflux    Anemia    Benign breast cyst in female    5 years ago.    Deafness in left ear    Difficult intubation    Very limited ROM in neck, large overbite. Grade III view with MIL 2 and cricoid pressure. Otherwise atraumatic.   Duodenitis    Heart murmur    Hyperlipidemia    Thrombocytosis    Uterine fibroid    Past Surgical History:  Procedure Laterality Date   BACK SURGERY     scoliosis   BREAST BIOPSY Left 7 years ago   Benign no scar   COLONOSCOPY WITH PROPOFOL N/A 06/28/2016   Procedure: COLONOSCOPY WITH PROPOFOL;  Surgeon: Jonathon Bellows, MD;  Location: [facility] ENDOSCOPY;  Service: Endoscopy;  Laterality: N/A;   ESOPHAGOGASTRODUODENOSCOPY (EGD) WITH PROPOFOL N/A 06/28/2016   Procedure: ESOPHAGOGASTRODUODENOSCOPY (EGD) WITH PROPOFOL;  Surgeon: Jonathon Bellows, MD;  Location: [facility] ENDOSCOPY;  Service: Endoscopy;  Laterality: N/A;   LAPAROSCOPIC ASSISTED VAGINAL HYSTERECTOMY N/A 10/27/2017   Procedure: LAPAROSCOPIC ASSISTED VAGINAL HYSTERECTOMY;  Surgeon: Harlin Heys, MD;  Location: [facility] ORS;  Service: Gynecology;  Laterality: N/A;   Social History   Socioeconomic History  Marital status: Single    Spouse name: Not on file   Number of children: Not on file   Years of education: Not on file   Highest education level: Not on file  Occupational History   Not on file  Tobacco Use   Smoking status: Every Day    Packs/day: 0.25    Years: 13.00    Pack years: 3.25    Types: Cigarettes   Smokeless tobacco: Never  Vaping Use   Vaping Use: Never used  Substance and Sexual Activity   Alcohol use: No   Drug use: No   Sexual activity: Yes    Birth control/protection: Surgical  Other  Topics Concern   Not on file  Social History Narrative   Not on file   Social Determinants of Health   Financial Resource Strain: Not on file  Food Insecurity: Not on file  Transportation Needs: Not on file  Physical Activity: Not on file  Stress: Not on file  Social Connections: Not on file  Intimate Partner Violence: Not on file   Family History  Problem Relation Age of Onset   Hyperlipidemia Mother    Hypertension Mother    COPD Mother    Heart disease Father    Hyperlipidemia Father    Heart disease Maternal Grandmother    Thyroid disease Sister    Breast cancer Neg Hx    Colon cancer Neg Hx    Cervical cancer Neg Hx    Current Outpatient Medications on File Prior to Visit  Medication Sig   fluticasone (FLONASE) 50 MCG/ACT nasal spray Place 2 sprays into both nostrils daily.   Multiple Vitamin (MULTIVITAMIN) tablet Take 1 tablet by mouth daily.   No current facility-administered medications on file prior to visit.    Review of Systems  Constitutional:  Negative for activity change, appetite change, chills, diaphoresis, fatigue and fever.  HENT:  Negative for congestion and hearing loss.   Eyes:  Negative for visual disturbance.  Respiratory:  Negative for cough, chest tightness, shortness of breath and wheezing.   Cardiovascular:  Negative for chest pain, palpitations and leg swelling.  Gastrointestinal:  Negative for abdominal pain, constipation, diarrhea, nausea and vomiting.  Genitourinary:  Negative for dysuria, frequency and hematuria.  Musculoskeletal:  Negative for arthralgias and neck pain.  Skin:  Negative for rash.  Neurological:  Negative for dizziness, weakness, light-headedness, numbness and headaches.  Hematological:  Negative for adenopathy.  Psychiatric/Behavioral:  Negative for behavioral problems, dysphoric mood and sleep disturbance.   Per HPI unless specifically indicated above      Objective:    BP 128/82    Pulse 83    Ht _0  (1.549  m)    Wt 124 lb 6.4 oz (56.4 kg)    LMP 10/20/2017 (Exact Date)    SpO2 100%    BMI 23.51 kg/m   Wt Readings from Last 3 Encounters:  09/03/21 124 lb 6.4 oz (56.4 kg)  11/07/20 129 lb 12.8 oz (58.9 kg)  08/29/20 132 lb 8 oz (60.1 kg)    Physical Exam Vitals and nursing note reviewed.  Constitutional:      General: She is not in acute distress.    Appearance: She is well-developed. She is not diaphoretic.     Comments: Well-appearing, comfortable, cooperative  HENT:     Head: Normocephalic and atraumatic.  Eyes:     General:        Right eye: No discharge.  Left eye: No discharge.     Conjunctiva/sclera: Conjunctivae normal.     Pupils: Pupils are equal, round, and reactive to light.  Neck:     Thyroid: No thyromegaly.  Cardiovascular:     Rate and Rhythm: Normal rate and regular rhythm.     Pulses: Normal pulses.     Heart sounds: Normal heart sounds. No murmur heard. Pulmonary:     Effort: Pulmonary effort is normal. No respiratory distress.     Breath sounds: Normal breath sounds. No wheezing or rales.  Abdominal:     General: Bowel sounds are normal. There is no distension.     Palpations: Abdomen is soft. There is no mass.     Tenderness: There is no abdominal tenderness.  Musculoskeletal:        General: No tenderness. Normal range of motion.     Cervical back: Normal range of motion and neck supple.     Comments: Upper / Lower Extremities: - Normal muscle tone, strength bilateral upper extremities 5/5, lower extremities 5/5  Lymphadenopathy:     Cervical: No cervical adenopathy.  Skin:    General: Skin is warm and dry.     Findings: No erythema or rash.  Neurological:     Mental Status: She is alert and oriented to person, place, and time.     Comments: Distal sensation intact to light touch all extremities  Psychiatric:        Mood and Affect: Mood normal.        Behavior: Behavior normal.        Thought Content: Thought content normal.     Comments:  Well groomed, good eye contact, normal speech and thoughts     Results for orders placed or performed in visit on 08/28/21  COMPLETE METABOLIC PANEL WITH GFR  Result Value Ref Range   Glucose, Bld 82 65 - 99 mg/dL   BUN 13 7 - 25 mg/dL   Creat 0.57 0.50 - 0.99 mg/dL   eGFR 111 > OR = 60 mL/min/1.60m   BUN/Creatinine Ratio NOT APPLICABLE 6 - 22 (calc)   Sodium 138 135 - 146 mmol/L   Potassium 4.0 3.5 - 5.3 mmol/L   Chloride 104 98 - 110 mmol/L   CO2 26 20 - 32 mmol/L   Calcium 9.5 8.6 - 10.2 mg/dL   Total Protein 7.1 6.1 - 8.1 g/dL   Albumin 4.4 3.6 - 5.1 g/dL   Globulin 2.7 1.9 - 3.7 g/dL (calc)   AG Ratio 1.6 1.0 - 2.5 (calc)   Total Bilirubin 0.7 0.2 - 1.2 mg/dL   Alkaline phosphatase (APISO) 61 31 - 125 U/L   AST 17 10 - 35 U/L   ALT 19 6 - 29 U/L  Lipid panel  Result Value Ref Range   Cholesterol 256 (H) <200 mg/dL   HDL 77 > OR = 50 mg/dL   Triglycerides 127 <150 mg/dL   LDL Cholesterol (Calc) 154 (H) mg/dL (calc)   Total CHOL/HDL Ratio 3.3 <5.0 (calc)   Non-HDL Cholesterol (Calc) 179 (H) <130 mg/dL (calc)  CBC with Differential/Platelet  Result Value Ref Range   WBC 8.9 3.8 - 10.8 Thousand/uL   RBC 4.96 3.80 - 5.10 Million/uL   Hemoglobin 14.8 11.7 - 15.5 g/dL   HCT 43.6 35.0 - 45.0 %   MCV 87.9 80.0 - 100.0 fL   MCH 29.8 27.0 - 33.0 pg   MCHC 33.9 32.0 - 36.0 g/dL   RDW 12.4 11.0 - 15.0 %  Platelets 405 (H) 140 - 400 Thousand/uL   MPV 8.8 7.5 - 12.5 fL   Neutro Abs 4,744 1,500 - 7,800 cells/uL   Lymphs Abs 3,302 850 - 3,900 cells/uL   Absolute Monocytes 659 200 - 950 cells/uL   Eosinophils Absolute 134 15 - 500 cells/uL   Basophils Absolute 62 0 - 200 cells/uL   Neutrophils Relative % 53.3 %   Total Lymphocyte 37.1 %   Monocytes Relative 7.4 %   Eosinophils Relative 1.5 %   Basophils Relative 0.7 %  Hemoglobin A1c  Result Value Ref Range   Hgb A1c MFr Bld 5.2 <5.7 % of total Hgb   Mean Plasma Glucose 103 mg/dL   eAG (mmol/L) 5.7 mmol/L  TSH   Result Value Ref Range   TSH 0.97 mIU/L      Assessment & Plan:   Problem List Items Addressed This Visit     Hyperlipidemia   Other Visit Diagnoses     Annual physical exam    -  Primary   Tobacco abuse       Relevant Medications   buPROPion (WELLBUTRIN SR) 150 MG 12 hr tablet   Acute non-recurrent frontal sinusitis       Relevant Medications   amoxicillin-clavulanate (AUGMENTIN) 875-125 MG tablet   Encounter for screening mammogram for malignant neoplasm of breast       Relevant Orders   MM 3D SCREEN BREAST BILATERAL   Adjustment disorder with depressed mood           Updated Health Maintenance information Reviewed recent lab results with patient Encouraged improvement to lifestyle with diet and exercise Goal of weight loss  Mammogram due, last 2019 - ordered today, patient to schedule.  Elevated LDL Goal to improve lifestyle nutrition In future if indicated consider statin, currently ASCVD risk is low  Sinusitis/Inner ear Will cover with Augmentin antibiotic course and re order Flonase  Tobacco Abuse Smoking Prior failed Chantix WIll start rx Wellbutrin BID 147m SR, quit instructions given on AVS  Adjustment depressed mood Insomnia Recent life change, will add wellbutrin for now for smoking can help both  Meds ordered this encounter  Medications   buPROPion (WELLBUTRIN SR) 150 MG 12 hr tablet    Sig: Take 1 tablet (150 mg total) by mouth 2 (two) times daily.    Dispense:  60 tablet    Refill:  2   amoxicillin-clavulanate (AUGMENTIN) 875-125 MG tablet    Sig: Take 1 tablet by mouth 2 (two) times daily.    Dispense:  20 tablet    Refill:  0   Orders Placed This Encounter  Procedures   MM 3D SCREEN BREAST BILATERAL    Standing Status:   Future    Standing Expiration Date:   09/03/2022    Order Specific Question:   Reason for Exam (SYMPTOM  OR DIAGNOSIS REQUIRED)    Answer:   Screening bilateral 3D Mammogram [place]    Order Specific Question:    Preferred imaging location?    Answer:   [facility]       Follow up plan: Return in about 1 year (around 09/03/2022) for 1 year fasting lab only then 1 week later Annual Physical.  ANobie Putnam DO [facility] 09/03/2021, 9:19 AM

## 2021-09-03 NOTE — Patient Instructions (Addendum)
Thank you for coming to the office today.  Mild elevated LDL cholesterol still Goal to limit cholesterol in diet, see below Keep up with walking exercise Not in range to require rx medication Could be genetic component.  For your smoking cessation, here is the plan: - Start Wellbutrin 150mg  tablets - take 1 tab daily for 3 days - Then, start taking 1 tab TWICE daily (about every 8 hours). Continue this for about 7 weeks. - To quit smoking - wait to start this treatment until you are mentally ready to quit. - Choose a quit date in the future. Start taking the medicine about 1-2 weeks away from your quit date. Reduce the number of cigarettes daily until you eventually QUIT completely on your quit date. Continue taking the Wellbutrin twice daily for total 7 weeks, we may continue for longer.  Sleep Hygiene Recommendations to promote healthy sleep in all patients, especially if symptoms of insomnia are worsening. Due to the nature of sleep rhythms, if your body gets "out of rhythm", it may take some time before your sleep cycle can be "reset".  Please try to follow as many of the following tips as you can, usually there are only a few of these are the primary cause of the problem.  ?To reset your sleep rhythm, go to bed and get up at the same time every day ?Sleep only long enough to feel rested and then get out of bed ?Do not try to force yourself to sleep. If you can't sleep, get out of bed and try again later. ?Avoid naps during the day, unless excessively tired. The more sleeping during the day, then the less sleep your body needs at night.  ?Have coffee, tea, and other foods that have caffeine only in the morning ?Exercise several days a week, but not right before bed ?If you drink alcohol, prefer to have appropriate drink with one meal, but prefer to avoid alcohol in the evening, and bedtime ?If you smoke, avoid smoking, especially in the evening  ?Avoid watching TV or looking at  phones, computers, or reading devices ("e-books") that give off light at least 30 minutes before bed. This artificial light sends "awake signals" to your brain and can make it harder to fall asleep. ?Make your bedroom a comfortable place where it is easy to fall asleep: Put up shades or special blackout curtains to block light from outside. Use a white noise machine to block noise. Keep the temperature cool. ?Try your best to solve or at least address your problems before you go to bed ?Use relaxation techniques to manage stress. Ask your health care provider to suggest some techniques that may work well for you. These may include: Breathing exercises. Routines to release muscle tension. Visualizing peaceful scenes.  DUE for FASTING BLOOD WORK (no food or drink after midnight before the lab appointment, only water or coffee without cream/sugar on the morning of)  SCHEDULE "Lab Only" visit in the morning at the clinic for lab draw in 1 YEAR  - Make sure Lab Only appointment is at about 1 week before your next appointment, so that results will be available  For Lab Results, once available within 2-3 days of blood draw, you can can log in to MyChart online to view your results and a brief explanation. Also, we can discuss results at next follow-up visit.    Please schedule a Follow-up Appointment to: Return in about 1 year (around 09/03/2022) for 1 year fasting lab only then  1 week later Annual Physical.  If you have any other questions or concerns, please feel free to call the office or send a message through [place]. You may also schedule an earlier appointment if necessary.  Additionally, you may be receiving a survey about your experience at our office within a few days to 1 week by e-mail or mail. We value your feedback.  Nobie Putnam, DO [place]

## 2021-09-17 MED ORDER — TRAZODONE HCL 50 MG PO TABS: 50.0000 mg | ORAL_TABLET | Freq: Every day | ORAL | 2 refills | Status: DC

## 2021-09-28 ENCOUNTER — Other Ambulatory Visit: Payer: Self-pay

## 2021-09-28 ENCOUNTER — Ambulatory Visit
Admission: RE | Admit: 2021-09-28 | Discharge: 2021-09-28 | Disposition: A | Discharge status: Home / Self Care | Payer: BC Managed Care – PPO | Source: Ambulatory Visit | Attending: Family Medicine | Admitting: Family Medicine

## 2021-09-28 DIAGNOSIS — Z1231 Encounter for screening mammogram for malignant neoplasm of breast: Secondary | ICD-10-CM | POA: Insufficient documentation

## 2021-10-31 ENCOUNTER — Other Ambulatory Visit: Payer: Self-pay

## 2021-10-31 ENCOUNTER — Encounter: Payer: Self-pay | Admitting: Emergency Medicine

## 2021-10-31 ENCOUNTER — Emergency Department
Admission: EM | Admit: 2021-10-31 | Discharge: 2021-10-31 | Disposition: A | Discharge status: Home / Self Care | Payer: BC Managed Care – PPO | Attending: Emergency Medicine | Admitting: Emergency Medicine

## 2021-10-31 DIAGNOSIS — F17200 Nicotine dependence, unspecified, uncomplicated: Secondary | ICD-10-CM | POA: Insufficient documentation

## 2021-10-31 DIAGNOSIS — H9201 Otalgia, right ear: Secondary | ICD-10-CM | POA: Diagnosis not present

## 2021-10-31 DIAGNOSIS — J302 Other seasonal allergic rhinitis: Secondary | ICD-10-CM | POA: Insufficient documentation

## 2021-10-31 DIAGNOSIS — H6981 Other specified disorders of Eustachian tube, right ear: Secondary | ICD-10-CM | POA: Insufficient documentation

## 2021-10-31 DIAGNOSIS — H6991 Unspecified Eustachian tube disorder, right ear: Secondary | ICD-10-CM | POA: Diagnosis not present

## 2021-10-31 MED ORDER — PREDNISONE 10 MG PO TABS: ORAL_TABLET | ORAL | 0 refills | Status: DC

## 2021-10-31 MED ORDER — AZELASTINE HCL 0.1 % NA SOLN: 2.0000 | Freq: Two times a day (BID) | NASAL | 12 refills | Status: DC

## 2021-10-31 NOTE — ED Notes (Signed)
13 yof with a c/c of sore throat and right sided ear pain for a couple of days. The pt does have a hx of tonsil stones and believes she may have a stone that is stuck.  ?

## 2021-10-31 NOTE — Discharge Instructions (Addendum)
Follow-up with your primary care provider if any continued problems or concerns.  Begin taking the medication as directed.  The prednisone is 3 tablets every day for the next 5 days and the Astelin no spray is to take the place of Flonase nasal spray.  You may also take Tylenol with this medication if needed for ear pain. ?

## 2021-10-31 NOTE — ED Provider Notes (Signed)
? ?[facility] ?Provider Note ? ? ? Event Date/Time  ? First MD Initiated Contact with Patient 10/31/21 1258   ?  (approximate) ? ? ?History  ? ?Sore Throat and Otalgia ? ? ?HPI ? ?Tasha Simmons is a 51 y.o. female   presents to the ED with complaint of right ear pain and sore throat that has been ongoing for approximately 2 weeks.  Patient states that she usually has some allergy issues in the spring and she has been using Flonase and Sudafed at home without complete relief.  She denies any fever or chills.  Patient is a smoker and has a history of reflux esophagitis and iron deficiency anemia.  She rates her pain as 6 out of 10. ? ?  ? ? ?Physical Exam  ? ?Triage Vital Signs: ?ED Triage Vitals  ?Enc Vitals Group  ?   BP 10/31/21 1243 137/85  ?   Pulse Rate 10/31/21 1243 88  ?   Resp 10/31/21 1243 18  ?   Temp 10/31/21 1243 98.8 ?F (37.1 ?C)  ?   Temp Source 10/31/21 1243 Oral  ?   SpO2 10/31/21 1243 95 %  ?   Weight 10/31/21 1241 124 lb 5.4 oz (56.4 kg)  ?   Height 10/31/21 1241 '5\' 1"'$  (1.549 m)  ?   Head Circumference --   ?   Peak Flow --   ?   Pain Score 10/31/21 1241 6  ?   Pain Loc --   ?   Pain Edu? --   ?   Excl. in [place]? --   ? ? ?Most recent vital signs: ?Vitals:  ? 10/31/21 1243 10/31/21 1352  ?BP: 137/85 133/78  ?Pulse: 88 78  ?Resp: 18 16  ?Temp: 98.8 ?F (37.1 ?C)   ?SpO2: 95% 99%  ? ? ? ?General: Awake, no distress.  ?CV:  Good peripheral perfusion.  ?Resp:  Normal effort.  Lungs are clear bilaterally. ?Abd:  No distention.  ?Other:  Nasal mucosa with mild congestion.  Right EAC is clear TM is dull.  Left EAC with mild fluid but no erythema or injection is noted.  Posterior pharynx without injection, erythema or exudate.  Uvula is midline.  No cervical lymphadenopathy appreciated.  Hearing in the right ear is also muffled and will not pop to equalize pressure. ? ? ?ED Results / Procedures / Treatments  ? ?Labs ?(all labs ordered are listed, but only abnormal results are  displayed) ?Labs Reviewed - No data to display ? ? ? ?PROCEDURES: ? ?Critical Care performed:  ? ?Procedures ? ? ?MEDICATIONS ORDERED IN ED: ?Medications - No data to display ? ? ?IMPRESSION / MDM / ASSESSMENT AND PLAN / ED COURSE  ?I reviewed the triage vital signs and the nursing notes. ? ? ?Differential diagnosis includes, but is not limited to, otitis media, seasonal allergies, eustachian tube dysfunction, serous otitis, pharyngitis. ? ?51 year old female presents to the ED with complaint of sore throat and right ear pain.  Patient denies any fever and states that the symptoms have been ongoing for 2 weeks.  She does have a history of allergies and has been using Flonase and Sudafed at home without complete relief.  Physical exam is consistent with a eustachian tube dysfunction along with seasonal allergies.  Since she has been using the Flonase for a long period of time we will switch to Astelin to see if this gives her better results and also prednisone 30 mg for the  next 5 days.  Patient may continue with Tylenol and Sudafed as needed.  She is to follow-up with her PCP if any continued problems. ? ? ? ?FINAL CLINICAL IMPRESSION(S) / ED DIAGNOSES  ? ?Final diagnoses:  ?Eustachian tube dysfunction, right  ?Seasonal allergic rhinitis, unspecified trigger  ? ? ? ?Rx / DC Orders  ? ?ED Discharge Orders   ? ?      Ordered  ?  azelastine (ASTELIN) 0.1 % nasal spray  2 times daily       ? 10/31/21 1348  ?  predniSONE (DELTASONE) 10 MG tablet       ? 10/31/21 1348  ? ?  ?  ? ?  ? ? ? ?Note:  This document was prepared using Dragon voice recognition software and may include unintentional dictation errors. ?  ?Johnn Hai, PA-C ?10/31/21 1355 ? ?  ?Merlyn Lot, MD ?10/31/21 1357 ? ?

## 2021-10-31 NOTE — ED Triage Notes (Signed)
Pt comes into the ED via POV c/o right ear pain and sore throat that has been ongoing x 2 weeks.  Pt in NAD at this time.  ?

## 2021-11-06 DIAGNOSIS — J358 Other chronic diseases of tonsils and adenoids: Secondary | ICD-10-CM | POA: Diagnosis not present

## 2021-12-11 ENCOUNTER — Other Ambulatory Visit: Payer: Self-pay

## 2021-12-11 ENCOUNTER — Emergency Department
Admission: EM | Admit: 2021-12-11 | Discharge: 2021-12-11 | Disposition: A | Discharge status: Home / Self Care | Payer: BC Managed Care – PPO | Attending: Emergency Medicine | Admitting: Emergency Medicine

## 2021-12-11 DIAGNOSIS — J020 Streptococcal pharyngitis: Secondary | ICD-10-CM | POA: Insufficient documentation

## 2021-12-11 DIAGNOSIS — H6691 Otitis media, unspecified, right ear: Secondary | ICD-10-CM | POA: Diagnosis not present

## 2021-12-11 DIAGNOSIS — J029 Acute pharyngitis, unspecified: Secondary | ICD-10-CM | POA: Diagnosis not present

## 2021-12-11 DIAGNOSIS — H6690 Otitis media, unspecified, unspecified ear: Secondary | ICD-10-CM

## 2021-12-11 LAB — GROUP A STREP BY PCR: Group A Strep by PCR: NOT DETECTED

## 2021-12-11 MED ORDER — AMOXICILLIN-POT CLAVULANATE 875-125 MG PO TABS: 1.0000 | ORAL_TABLET | Freq: Two times a day (BID) | ORAL | 0 refills | Status: DC

## 2021-12-11 NOTE — Discharge Instructions (Signed)
Please take the antibiotics for your ear infection.  Please return the emergency department for any new, worsening, or change in symptoms including change in voice, trouble swallowing, neck pain, fevers, shaking chills, or any other concerns. ?

## 2021-12-11 NOTE — ED Provider Notes (Signed)
? ?[facility] ?Provider Note ? ? ? Event Date/Time  ? First MD Initiated Contact with Patient 12/11/21 1754   ?  (approximate) ? ? ?History  ? ?Sore Throat ? ? ?HPI ? ?Tasha Simmons is a 51 y.o. female who presents today for evaluation of sore throat for the past 1 week.  Patient denies any difficulty swallowing, but reports mild pain with swallowing.  Over the past 4 days she has had pain also in her right ear.  She reports that she has had significant nasal congestion.  No cough.  No trismus or drooling.  No change in voice.  No change in hearing.  No otorrhea.  No known sick contacts. ? ?  ?Patient Active Problem List  ? Diagnosis Date Noted  ? Back pain 11/07/2020  ? Post-operative state 10/27/2017  ? Thrombocytosis 06/17/2017  ? Uterine fibroid 04/04/2017  ? 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 Heart murmur, systolic 76/16/0737  ? Hyperlipidemia 09/09/2016  ? Duodenitis   ? Polyp of duodenum   ? 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 Iron deficiency anemia 02/12/2016  ? Benign cyst of left breast 07/07/2015  ? Gastroesophageal reflux disease without esophagitis 07/07/2015  ? ? ?  ? ? ?Physical Exam  ? ?Triage Vital Signs: ?ED Triage Vitals  ?Enc Vitals Group  ?   BP 12/11/21 1640 (!) 128/97  ?   Pulse Rate 12/11/21 1640 92  ?   Resp 12/11/21 1640 16  ?   Temp 12/11/21 1640 98.4 ?F (36.9 ?C)  ?   Temp Source 12/11/21 1640 Oral  ?   SpO2 12/11/21 1640 95 %  ?   Weight 12/11/21 1644 125 lb (56.7 kg)  ?   Height --   ?   Head Circumference --   ?   Peak Flow --   ?   Pain Score 12/11/21 1644 5  ?   Pain Loc --   ?   Pain Edu? --   ?   Excl. in [place]? --   ? ? ?Most recent vital signs: ?Vitals:  ? 12/11/21 1640  ?BP: (!) 128/97  ?Pulse: 92  ?Resp: 16  ?Temp: 98.4 ?F (36.9 ?C)  ?SpO2: 95%  ? ? ?Physical Exam ?Vitals and nursing note reviewed.  ?Constitutional:   ?   General: Awake and alert. No acute distress. ?   Appearance: Normal appearance. He is  well-developed and normal weight.  ?HENT:  ?   Head: Normocephalic and atraumatic.  ?   Mouth/Throat: Uvula midline.  No tonsillar exudate.  No soft palate fluctuance.  No trismus.  No voice change.  No sublingual swelling.  No tender cervical lymphadenopathy.  No nuchal rigidity ?   Mouth: Mucous membranes are moist.  ?   Nasal congestion present ?Left TM: Normal pinna.  No proptosis of the pinna or mastoid tenderness or erythema. Normal canal, no effusion ?Right TM: Normal pinna.  No proptosis of the pinna or mastoid tenderness or erythema.  Normal canal.  TM is erythematous with effusion present.  Loss of light reflex ?Eyes:  ?   General: PERRL. Normal EOMs     ?   Right eye: No discharge.     ?   Left eye: No discharge.  ?   Conjunctiva/sclera: Conjunctivae normal.  ?Cardiovascular:  ?   Rate and Rhythm: Normal rate and regular rhythm.  ?   Pulses: Normal pulses.  ?   Heart sounds:  Normal heart sounds ?Pulmonary:  ?   Effort: Pulmonary effort is normal. No respiratory distress.  ?   Breath sounds: Normal breath sounds.  ?Abdominal:  ?   Abdomen is soft. There is no abdominal tenderness. No rebound or guarding. No distention. ?Musculoskeletal:     ?   General: No swelling. Normal range of motion.  ?   Cervical back: Normal range of motion and neck supple.  ?Lymphadenopathy:  ?   Cervical: No cervical adenopathy.  ?Skin: ?   General: Skin is warm and dry.  ?   Capillary Refill: Capillary refill takes less than 2 seconds.  ?   Findings: No rash.  ?Neurological:  ?   Mental Status: He is alert.  ?  ? ? ?ED Results / Procedures / Treatments  ? ?Labs ?(all labs ordered are listed, but only abnormal results are displayed) ?Labs Reviewed  ?GROUP A STREP BY PCR  ? ? ? ?EKG ? ? ? ? ?RADIOLOGY ? ? ? ? ?PROCEDURES: ? ?Critical Care performed:  ? ?Procedures ? ? ?MEDICATIONS ORDERED IN ED: ?Medications - No data to display ? ? ?IMPRESSION / MDM / ASSESSMENT AND PLAN / ED COURSE  ?I reviewed the triage vital signs and the  nursing notes. ? ? ?Differential diagnosis includes, but is not limited to, strep pharyngitis, viral URI, otitis media, otitis externa, eustachian tube dysfunction, peritonsillar or retropharyngeal abscess.  Patient is awake and alert, hemodynamically stable and afebrile.  Her uvula is midline, no neck pain, no uvular deviation, no voice change, no drooling, no dysphagia, no rigidity, do not suspect peritonsillar or retropharyngeal abscess.  No sublingual swelling concerning for Ludwick's angina.  She is overall nontoxic in appearance.  Discussed the option for obtaining CT scan for further evaluation, the patient declined.  Strep is negative.  She was also offered COVID/flu/RSV test and declined.  Her right ear appears to have a otitis media with erythematous TM, loss of light reflex, and effusion present.  She was started on antibiotics for this.  No proptosis of the pinna or mastoid tenderness or erythema to suggest mastoiditis.  We discussed return precautions and outpatient follow-up.  Patient understands and agrees with plan.  Discharged in stable condition.  ? ? ?FINAL CLINICAL IMPRESSION(S) / ED DIAGNOSES  ? ?Final diagnoses:  ?Pharyngitis, unspecified etiology  ?Acute otitis media, unspecified otitis media type  ? ? ? ?Rx / DC Orders  ? ?ED Discharge Orders   ? ?      Ordered  ?  amoxicillin-clavulanate (AUGMENTIN) 875-125 MG tablet  2 times daily       ? 12/11/21 1815  ? ?  ?  ? ?  ? ? ? ?Note:  This document was prepared using Dragon voice recognition software and may include unintentional dictation errors. ?  ?Marquette Old, PA-C ?12/11/21 2118 ? ?  ?Blake Divine, MD ?12/11/21 2302 ? ?

## 2021-12-11 NOTE — ED Triage Notes (Signed)
Pt arrives with c/o sore throat about a week ago. Per pt, she has a hx of a tonsillar stone. Pt also c/o right ear pain. Pt denies fevers.  ?

## 2021-12-13 ENCOUNTER — Ambulatory Visit: Payer: Self-pay | Admitting: *Deleted

## 2021-12-13 DIAGNOSIS — J0110 Acute frontal sinusitis, unspecified: Secondary | ICD-10-CM

## 2021-12-13 MED ORDER — AMOXICILLIN 500 MG PO TABS: 500.0000 mg | ORAL_TABLET | Freq: Two times a day (BID) | ORAL | 0 refills | Status: DC

## 2021-12-13 NOTE — Telephone Encounter (Signed)
  Chief Complaint: heart palpitations after taking antibiotics Symptoms: felt heart "flutters" after taking augmentin on day 3 of medication course. No heart flutters at this time  Frequency: after taking 3 doses of antibiotics  Pertinent Negatives: Patient denies chest pain , no difficulty breathing, no heart palpitations now , no sweating no dizziness  Disposition: '[]'$ ED /'[]'$ Urgent Care (no appt availability in office) / '[]'$ Appointment(In office/virtual)/ '[]'$  [place] [facility]/ '[x]'$ Home Care/ '[]'$ Refused Recommended Disposition /'[]'$ [place] [facility]/ '[x]'$  Follow-up with PCP Additional Notes:   Patient reports she is not going to take another dose of augmentin until PCP responds. Requesting a call back. Please advise . No appt scheduled. Patient aware if sx reoccur call back or go to UC/ED.    Reason for Disposition  Palpitations  Answer Assessment - Initial Assessment Questions 1. DESCRIPTION: "Please describe your heart rate or heartbeat that you are having" (e.g., fast/slow, regular/irregular, skipped or extra beats, "palpitations")     "Feels like flutters" 2. ONSET: "When did it start?" (Minutes, hours or days)      After 3rd day of taking antibiotics  3. DURATION: "How long does it last" (e.g., seconds, minutes, hours)     A few seconds  4. PATTERN "Does it come and go, or has it been constant since it started?"  "Does it get worse with exertion?"   "Are you feeling it now?"     Comes and goes x 2 in one day not feeling now  5. TAP: "Using your hand, can you tap out what you are feeling on a chair or table in front of you, so that I can hear?" (Note: not all patients can do this)       na 6. HEART RATE: "Can you tell me your heart rate?" "How many beats in 15 seconds?"  (Note: not all patients can do this)       na 7. RECURRENT SYMPTOM: "Have you ever had this before?" If Yes, ask: "When was the last time?" and "What happened that time?"      na 8. CAUSE: "What do you think is  causing the palpitations?"     Not sure started antibiotics 3 days ago  9. CARDIAC HISTORY: "Do you have any history of heart disease?" (e.g., heart attack, angina, bypass surgery, angioplasty, arrhythmia)      no 10. OTHER SYMPTOMS: "Do you have any other symptoms?" (e.g., dizziness, chest pain, sweating, difficulty breathing)       Denies  11. PREGNANCY: "Is there any chance you are pregnant?" "When was your last menstrual period?"       na  Protocols used: Heart Rate and Heartbeat Questions-A-AH

## 2021-12-13 NOTE — Telephone Encounter (Signed)
Spoke with the patient and she is okay with switching to Amoxicillin. She has been instructed to stop the Augmentin. Dr Raliegh Ip is aware and will be sending that over to [facility] on [address].

## 2021-12-13 NOTE — Telephone Encounter (Signed)
I am not familiar with flutter or palpitations or arrhythmia as a side effect of Augmentin.  I am okay to switch to regular Amoxicillin instead if she prefers this option.  She has been on regular Amoxicillin in the past, last was 2017.  Let me know and I can switch it.  Nobie Putnam, [facility] 12/13/2021, 2:34 PM

## 2021-12-25 ENCOUNTER — Encounter: Payer: Self-pay | Admitting: Family Medicine

## 2021-12-25 DIAGNOSIS — B379 Candidiasis, unspecified: Secondary | ICD-10-CM

## 2021-12-25 MED ORDER — FLUCONAZOLE 150 MG PO TABS: ORAL_TABLET | ORAL | 1 refills | Status: DC

## 2022-04-15 ENCOUNTER — Encounter: Payer: Self-pay | Admitting: Physician Assistant

## 2022-04-15 ENCOUNTER — Ambulatory Visit: Payer: BC Managed Care – PPO | Admitting: Physician Assistant

## 2022-04-15 VITALS — BP 120/77 | HR 65 | Ht 61.0 in | Wt 112.8 lb

## 2022-04-15 DIAGNOSIS — R630 Anorexia: Secondary | ICD-10-CM | POA: Diagnosis not present

## 2022-04-15 DIAGNOSIS — R5383 Other fatigue: Secondary | ICD-10-CM | POA: Diagnosis not present

## 2022-04-15 NOTE — Progress Notes (Signed)
Acute Office Visit   Patient: Tasha Simmons   DOB: [birth year]   51 y.o. Female  MRN: [id number] Visit Date: 04/15/2022  Today's healthcare provider: Dani Gobble Valeri Sula, PA-C  Introduced myself to the patient as a Journalist, newspaper and provided education on APPs in clinical practice.    Chief Complaint  Patient presents with   Weight Loss   Subjective    HPI   Reports weight loss for a few months  Reports she was at 139 then decreased to 125 and now is at 112 today Reports when she eats she feels full rather quickly  Reports changes to her daily dietary intake-states she doesn't have much of an appetite for the past few months  Reports she has been taking a multivitamin a few months ago but this has not improved her appetite Diet: She is only eating one meal per day- typically comprised of a single item like a burger or biscuit with protein source  She reports that she snacks a lot  Exercise: she walks about 2 miles per day but has reduced this to every other day She is having regular bowel movements  She reports changes in sleep as well Reports she was rxd Trazodone but this made her chest feel "funny" so she stopped taking it  She smokes cigarettes Denies drinking alcohol Denies hx of disordered eating She denies pain or discomfort while eating, denies GERD symptoms or abdominal pain    Medications: Outpatient Medications Prior to Visit  Medication Sig   azelastine (ASTELIN) 0.1 % nasal spray Place 2 sprays into both nostrils 2 (two) times daily. Use in each nostril as directed   Multiple Vitamin (MULTIVITAMIN) tablet Take 1 tablet by mouth daily.   [DISCONTINUED] amoxicillin (AMOXIL) 500 MG tablet Take 1 tablet (500 mg total) by mouth 2 (two) times daily.   [DISCONTINUED] buPROPion (WELLBUTRIN SR) 150 MG 12 hr tablet Take 1 tablet (150 mg total) by mouth 2 (two) times daily.   [DISCONTINUED] fluconazole (DIFLUCAN) 150 MG tablet Take one tablet by mouth on Day 1. Repeat dose 2nd  tablet on Day 3.   [DISCONTINUED] predniSONE (DELTASONE) 10 MG tablet Take 3 tablets with once a day for 5 days   [DISCONTINUED] traZODone (DESYREL) 50 MG tablet Take 1 tablet (50 mg total) by mouth at bedtime.   No facility-administered medications prior to visit.    Review of Systems  Constitutional:  Positive for appetite change, fatigue and unexpected weight change. Negative for chills, diaphoresis and fever.  Eyes:  Negative for pain, discharge and redness.  Respiratory:  Negative for cough, chest tightness, shortness of breath and wheezing.   Cardiovascular:  Negative for chest pain, palpitations and leg swelling.  Gastrointestinal:  Negative for blood in stool, constipation, diarrhea, nausea and vomiting.  Endocrine: Negative for cold intolerance and heat intolerance.  Skin:  Negative for color change and rash.  Neurological:  Negative for dizziness, tremors, weakness, numbness and headaches.  Psychiatric/Behavioral:  Positive for sleep disturbance. Negative for agitation and decreased concentration. The patient is not nervous/anxious and is not hyperactive.        Objective    BP 120/77   Pulse 65   Ht '5\' 1"'$  (1.549 m)   Wt 112 lb 12.8 oz (51.2 kg)   LMP 10/20/2017 (Exact Date)   SpO2 100%   BMI 21.31 kg/m    Physical Exam Vitals reviewed.  Constitutional:      General: She is  awake.     Appearance: Normal appearance. She is well-developed, well-groomed and normal weight.  HENT:     Head: Normocephalic and atraumatic.  Cardiovascular:     Rate and Rhythm: Normal rate and regular rhythm.     Pulses: Normal pulses.          Radial pulses are 2+ on the right side and 2+ on the left side.     Heart sounds: Normal heart sounds. No murmur heard.    No friction rub. No gallop.  Pulmonary:     Effort: Pulmonary effort is normal.     Breath sounds: Normal breath sounds and air entry. No decreased breath sounds, wheezing, rhonchi or rales.  Abdominal:     General:  Abdomen is flat. Bowel sounds are normal.     Palpations: Abdomen is soft.     Tenderness: There is no abdominal tenderness.  Neurological:     Mental Status: She is alert.  Psychiatric:        Attention and Perception: Attention and perception normal.        Mood and Affect: Mood normal. Affect is flat.        Speech: Speech normal.        Behavior: Behavior normal. Behavior is cooperative.        Cognition and Memory: Cognition normal.       Results for orders placed or performed in visit on 04/15/22  TEST AUTHORIZATION  Result Value Ref Range   TEST NAME: VIT B12 AND VIT D    TEST CODE: 710, 17306    CLIENT CONTACT: LAURA    REPORT ALWAYS MESSAGE SIGNATURE      Assessment & Plan      No follow-ups on file.       Problem List Items Addressed This Visit       Other   Decrease in appetite - Primary    Acute on chronic, new concern Pt reports decreased appetite and dietary intake over the last few months She is not sure of the cause of this at this time Denies, GI upset, nausea, vomiting, diarrhea, GERD, abdominal pain Will check TSH, CMP, CBC, Vitamin D and B12 for potential etiology and electrolyte changes from diet changes May need to refer to GI for eval given her hx of GERD and reduced appetite with this. May need referral to Nutrition? Cannot rule out psych etiology at this time either  Results of labs to dictate further management Recommend tracking calories to see where she could improve with meals or snacking Discussed importance of varied diet and balanced macros for over health and weight management  Follow up as needed for persistent or progressing symptoms       Relevant Orders   TSH   CBC w/Diff/Platelet   Iron, TIBC and Ferritin Panel   COMPLETE METABOLIC PANEL WITH GFR   B12   Vitamin D (25 hydroxy)   Fatigue    Acute, new concern Suspect this is secondary to reduced dietary intake and potential electrolyte abnormalities Will check CMP, CBC,  iron, B12, Vitamin D, and TSH as potential causes Results to dictate further management       Relevant Orders   CBC w/Diff/Platelet   Iron, TIBC and Ferritin Panel   B12   Vitamin D (25 hydroxy)     No follow-ups on file.   I, Khayree Delellis E Ventura Hollenbeck, PA-C, have reviewed all documentation for this visit. The documentation on 04/15/22 for the exam, diagnosis, procedures, and  orders are all accurate and complete.   Talitha Givens, MHS, PA-C [facility]

## 2022-04-15 NOTE — Assessment & Plan Note (Signed)
Acute, new concern Suspect this is secondary to reduced dietary intake and potential electrolyte abnormalities Will check CMP, CBC, iron, B12, Vitamin D, and TSH as potential causes Results to dictate further management

## 2022-04-15 NOTE — Assessment & Plan Note (Addendum)
Acute on chronic, new concern Pt reports decreased appetite and dietary intake over the last few months She is not sure of the cause of this at this time Denies, GI upset, nausea, vomiting, diarrhea, GERD, abdominal pain Will check TSH, CMP, CBC, Vitamin D and B12 for potential etiology and electrolyte changes from diet changes May need to refer to GI for eval given her hx of GERD and reduced appetite with this. May need referral to Nutrition? Cannot rule out psych etiology at this time either  Results of labs to dictate further management Recommend tracking calories to see where she could improve with meals or snacking Discussed importance of varied diet and balanced macros for over health and weight management  Follow up as needed for persistent or progressing symptoms

## 2022-04-16 LAB — CBC WITH DIFFERENTIAL/PLATELET
Absolute Monocytes: 453 cells/uL (ref 200–950)
Basophils Absolute: 51 cells/uL (ref 0–200)
Basophils Relative: 0.7 %
Eosinophils Absolute: 88 cells/uL (ref 15–500)
Eosinophils Relative: 1.2 %
HCT: 42.9 % (ref 35.0–45.0)
Hemoglobin: 14.6 g/dL (ref 11.7–15.5)
Lymphs Abs: 2489 cells/uL (ref 850–3900)
MCH: 29.9 pg (ref 27.0–33.0)
MCHC: 34 g/dL (ref 32.0–36.0)
MCV: 87.9 fL (ref 80.0–100.0)
MPV: 8.9 fL (ref 7.5–12.5)
Monocytes Relative: 6.2 %
Neutro Abs: 4219 cells/uL (ref 1500–7800)
Neutrophils Relative %: 57.8 %
Platelets: 395 10*3/uL (ref 140–400)
RBC: 4.88 10*6/uL (ref 3.80–5.10)
RDW: 11.9 % (ref 11.0–15.0)
Total Lymphocyte: 34.1 %
WBC: 7.3 10*3/uL (ref 3.8–10.8)

## 2022-04-16 LAB — IRON,TIBC AND FERRITIN PANEL
%SAT: 34 % (calc) (ref 16–45)
Ferritin: 180 ng/mL (ref 16–232)
Iron: 104 ug/dL (ref 45–160)
TIBC: 307 mcg/dL (calc) (ref 250–450)

## 2022-04-16 LAB — COMPLETE METABOLIC PANEL WITH GFR
AG Ratio: 1.6 (calc) (ref 1.0–2.5)
ALT: 13 U/L (ref 6–29)
AST: 16 U/L (ref 10–35)
Albumin: 4.5 g/dL (ref 3.6–5.1)
Alkaline phosphatase (APISO): 65 U/L (ref 37–153)
BUN: 10 mg/dL (ref 7–25)
CO2: 30 mmol/L (ref 20–32)
Calcium: 9.6 mg/dL (ref 8.6–10.4)
Chloride: 107 mmol/L (ref 98–110)
Creat: 0.5 mg/dL (ref 0.50–1.03)
Globulin: 2.8 g/dL (calc) (ref 1.9–3.7)
Glucose, Bld: 83 mg/dL (ref 65–139)
Potassium: 4.1 mmol/L (ref 3.5–5.3)
Sodium: 141 mmol/L (ref 135–146)
Total Bilirubin: 0.3 mg/dL (ref 0.2–1.2)
Total Protein: 7.3 g/dL (ref 6.1–8.1)
eGFR: 114 mL/min/{1.73_m2} (ref >=60)

## 2022-04-16 LAB — TEST AUTHORIZATION: TEST CODE:: 927

## 2022-04-16 LAB — TSH: TSH: 0.81 mIU/L

## 2022-04-25 ENCOUNTER — Other Ambulatory Visit: Payer: Self-pay | Admitting: Physician Assistant

## 2022-04-25 DIAGNOSIS — R630 Anorexia: Secondary | ICD-10-CM

## 2022-04-25 DIAGNOSIS — F17210 Nicotine dependence, cigarettes, uncomplicated: Secondary | ICD-10-CM

## 2022-12-27 ENCOUNTER — Telehealth: Payer: BC Managed Care – PPO | Admitting: Physician Assistant

## 2022-12-27 DIAGNOSIS — J019 Acute sinusitis, unspecified: Secondary | ICD-10-CM | POA: Diagnosis not present

## 2022-12-27 MED ORDER — FLUTICASONE PROPIONATE 50 MCG/ACT NA SUSP: 2.0000 | Freq: Every day | NASAL | 6 refills | Status: AC

## 2022-12-27 NOTE — Progress Notes (Signed)
E-Visit for Sinus Problems  We are sorry that you are not feeling well.  Here is how we plan to help!  Based on what you have shared with me it looks like you have sinusitis.  Sinusitis is inflammation and infection in the sinus cavities of the head.  Based on your presentation I believe you most likely have Acute Viral Sinusitis.This is an infection most likely caused by a virus. There is not specific treatment for viral sinusitis other than to help you with the symptoms until the infection runs its course.  You may use an oral decongestant such as Mucinex D or if you have glaucoma or high blood pressure use plain Mucinex. Saline nasal spray help and can safely be used as often as needed for congestion, I have prescribed: Fluticasone nasal spray two sprays in each nostril once a day  Some authorities believe that zinc sprays or the use of Echinacea may shorten the course of your symptoms.  Sinus infections are not as easily transmitted as other respiratory infection, however we still recommend that you avoid close contact with loved ones, especially the very young and elderly.  Remember to wash your hands thoroughly throughout the day as this is the number one way to prevent the spread of infection!  Home Care: Only take medications as instructed by your medical team. Do not take these medications with alcohol. A steam or ultrasonic humidifier can help congestion.  You can place a towel over your head and breathe in the steam from hot water coming from a faucet. Avoid close contacts especially the very young and the elderly. Cover your mouth when you cough or sneeze. Always remember to wash your hands.  Get Help Right Away If: You develop worsening fever or sinus pain. You develop a severe head ache or visual changes. Your symptoms persist after you have completed your treatment plan.  Make sure you Understand these instructions. Will watch your condition. Will get help right away if you  are not doing well or get worse.   Thank you for choosing an e-visit.  Your e-visit answers were reviewed by a board certified advanced clinical practitioner to complete your personal care plan. Depending upon the condition, your plan could have included both over the counter or prescription medications.  Please review your pharmacy choice. Make sure the pharmacy is open so you can pick up prescription now. If there is a problem, you may contact your provider through MyChart messaging and have the prescription routed to another pharmacy.  Your safety is important to us. If you have drug allergies check your prescription carefully.   For the next 24 hours you can use MyChart to ask questions about today's visit, request a non-urgent call back, or ask for a work or school excuse. You will get an email in the next two days asking about your experience. I hope that your e-visit has been valuable and will speed your recovery.  I have spent 5 minutes in review of e-visit questionnaire, review and updating patient chart, medical decision making and response to patient.   Amily Depp Z Ward, PA-C      

## 2023-01-15 ENCOUNTER — Telehealth: Payer: BC Managed Care – PPO | Admitting: Nurse Practitioner

## 2023-01-15 DIAGNOSIS — H66009 Acute suppurative otitis media without spontaneous rupture of ear drum, unspecified ear: Secondary | ICD-10-CM | POA: Diagnosis not present

## 2023-01-15 DIAGNOSIS — J0140 Acute pansinusitis, unspecified: Secondary | ICD-10-CM | POA: Diagnosis not present

## 2023-01-15 MED ORDER — AMOXICILLIN-POT CLAVULANATE 875-125 MG PO TABS: 1.0000 | ORAL_TABLET | Freq: Two times a day (BID) | ORAL | 0 refills | Status: DC

## 2023-01-15 NOTE — Progress Notes (Signed)
E-Visit for Sinus Problems  We are sorry that you are not feeling well.  Here is how we plan to help!  Based on what you have shared with me it looks like you have sinusitis.  Sinusitis is inflammation and infection in the sinus cavities of the head.  Based on your presentation I believe you most likely have Acute Bacterial Sinusitis.  This is an infection caused by bacteria and is treated with antibiotics. I have prescribed Augmentin 875mg /125mg  one tablet twice daily with food, for 10 days, this will also treat an ear infection as well. You may use an oral decongestant such as Mucinex D or if you have glaucoma or high blood pressure use plain Mucinex. Saline nasal spray help and can safely be used as often as needed for congestion.  If you develop worsening sinus pain, fever or notice severe headache and vision changes, or if symptoms are not better after completion of antibiotic, please schedule an appointment with a health care provider.    Sinus infections are not as easily transmitted as other respiratory infection, however we still recommend that you avoid close contact with loved ones, especially the very young and elderly.  Remember to wash your hands thoroughly throughout the day as this is the number one way to prevent the spread of infection!  Home Care: Only take medications as instructed by your medical team. Complete the entire course of an antibiotic. Do not take these medications with alcohol. A steam or ultrasonic humidifier can help congestion.  You can place a towel over your head and breathe in the steam from hot water coming from a faucet. Avoid close contacts especially the very young and the elderly. Cover your mouth when you cough or sneeze. Always remember to wash your hands.  Get Help Right Away If: You develop worsening fever or sinus pain. You develop a severe head ache or visual changes. Your symptoms persist after you have completed your treatment plan.  Make sure  you Understand these instructions. Will watch your condition. Will get help right away if you are not doing well or get worse.  Thank you for choosing an e-visit.  Your e-visit answers were reviewed by a board certified advanced clinical practitioner to complete your personal care plan. Depending upon the condition, your plan could have included both over the counter or prescription medications.  Please review your pharmacy choice. Make sure the pharmacy is open so you can pick up prescription now. If there is a problem, you may contact your provider through [employer] and have the prescription routed to another pharmacy.  Your safety is important to [place]. If you have drug allergies check your prescription carefully.   For the next 24 hours you can use MyChart to ask questions about today's visit, request a non-urgent call back, or ask for a work or school excuse. You will get an email in the next two days asking about your experience. I hope that your e-visit has been valuable and will speed your recovery.   Meds ordered this encounter  Medications   amoxicillin-clavulanate (AUGMENTIN) 875-125 MG tablet    Sig: Take 1 tablet by mouth 2 (two) times daily.    Dispense:  20 tablet    Refill:  0    I spent approximately 5 minutes reviewing the patient's history, current symptoms and coordinating their care today.

## 2023-02-03 ENCOUNTER — Ambulatory Visit: Payer: BC Managed Care – PPO | Admitting: Internal Medicine

## 2023-02-03 ENCOUNTER — Encounter: Payer: Self-pay | Admitting: Internal Medicine

## 2023-02-03 VITALS — BP 122/84 | HR 77 | Temp 97.1°F | Wt 102.0 lb

## 2023-02-03 DIAGNOSIS — J0100 Acute maxillary sinusitis, unspecified: Secondary | ICD-10-CM

## 2023-02-03 MED ORDER — DOXYCYCLINE HYCLATE 100 MG PO TABS: 100.0000 mg | ORAL_TABLET | Freq: Two times a day (BID) | ORAL | 0 refills | Status: AC

## 2023-02-03 NOTE — Patient Instructions (Signed)

## 2023-02-03 NOTE — Progress Notes (Signed)
HPI  Patient presents to clinic today with complaint of headache, nasal congestion, ear fullness and cough.  She reports this started 2 weeks ago.  The headache is located in her forehead.  She describes the pain as pressure.  She is also having sinus pain and pressure in her cheeks.  She is unable to blow anything out of her nose.  The cough is mostly nonproductive.  She denies runny nose, sore throat, shortness of breath, chest pain, nausea, vomiting or diarrhea.  She denies fever, chills or body aches.  She did an e-visit for the same 6/19 and was prescribed Augmentin however she reports she cannot tolerate this medication due to increase in headache and muscle spasms.  She has tried Tylenol sinus OTC with minimal relief of symptoms.  Review of Systems     Past Medical History:  Diagnosis Date   Acid reflux    Anemia    Benign breast cyst in female    5 years ago.    Deafness in left ear    Difficult intubation    Very limited ROM in neck, large overbite. Grade III view with MIL 2 and cricoid pressure. Otherwise atraumatic.   Duodenitis    Heart murmur    Hyperlipidemia    Post-operative state 10/27/2017   Thrombocytosis    Uterine fibroid     Family History  Problem Relation Age of Onset   Hyperlipidemia Mother    Hypertension Mother    COPD Mother    Heart disease Father    Hyperlipidemia Father    Heart disease Maternal Grandmother    Thyroid disease Sister    Breast cancer Neg Hx    Colon cancer Neg Hx    Cervical cancer Neg Hx     Social History   Socioeconomic History   Marital status: Single    Spouse name: Not on file   Number of children: Not on file   Years of education: Not on file   Highest education level: Not on file  Occupational History   Not on file  Tobacco Use   Smoking status: Every Day    Packs/day: 0.25    Years: 13.00    Additional pack years: 0.00    Total pack years: 3.25    Types: Cigarettes   Smokeless tobacco: Never  Vaping Use    Vaping Use: Never used  Substance and Sexual Activity   Alcohol use: No   Drug use: No   Sexual activity: Yes    Birth control/protection: Surgical  Other Topics Concern   Not on file  Social History Narrative   Not on file   Social Determinants of Health   Financial Resource Strain: Not on file  Food Insecurity: Not on file  Transportation Needs: Not on file  Physical Activity: Not on file  Stress: Not on file  Social Connections: Not on file  Intimate Partner Violence: Not on file    No Known Allergies   Constitutional: Positive headache. Denies fatigue, fever or abrupt weight changes.  HEENT:  Positive facial pain, nasal congestion. Denies eye redness, ear pain, ringing in the ears, wax buildup, runny nose or sore throat. Respiratory: Positive cough. Denies difficulty breathing or shortness of breath.  Cardiovascular: Denies chest pain, chest tightness, palpitations or swelling in the hands or feet.   No other specific complaints in a complete review of systems (except as listed in HPI above).  Objective:   BP 122/84 (BP Location: Left Arm, Patient Position: Sitting,  Cuff Size: Normal)   Pulse 77   Temp (!) 97.1 F (36.2 C) (Temporal)   Wt 102 lb (46.3 kg)   LMP 10/20/2017 (Exact Date)   SpO2 100%   BMI 19.27 kg/m    General: Appears her stated age, well developed, well nourished in NAD. HEENT: Head: normal shape and size, maxillary sinus tenderness noted; Eyes: sclera white, no icterus, conjunctiva pink; Ears: Tm's gray and intact, normal light reflex; Nose: mucosa boggy and moist, septum midline; Throat/Mouth: + PND. Teeth present, mucosa erythematous and moist, no exudate noted, no lesions or ulcerations noted.  Neck:  No adenopathy noted.  Cardiovascular: Normal rate and rhythm. S1,S2 noted.  No murmur, rubs or gallops noted.  Pulmonary/Chest: Normal effort and positive vesicular breath sounds. No respiratory distress. No wheezes, rales or ronchi noted.        Assessment & Plan:   Acute bacterial sinusitis  Can use a Neti Pot which can be purchased from your local drug store. Flonase 2 sprays each nostril for 3 days and then as needed. eRx for doxycycline BID for 10 days  RTC as needed or if symptoms persist. Nicki Reaper, NP

## 2023-07-15 ENCOUNTER — Emergency Department: Payer: BC Managed Care – PPO

## 2023-07-15 ENCOUNTER — Other Ambulatory Visit: Payer: Self-pay

## 2023-07-15 ENCOUNTER — Emergency Department
Admission: EM | Admit: 2023-07-15 | Discharge: 2023-07-15 | Disposition: A | Discharge status: Home / Self Care | Payer: BC Managed Care – PPO | Attending: Emergency Medicine | Admitting: Emergency Medicine

## 2023-07-15 DIAGNOSIS — Y92410 Unspecified street and highway as the place of occurrence of the external cause: Secondary | ICD-10-CM | POA: Insufficient documentation

## 2023-07-15 DIAGNOSIS — M47817 Spondylosis without myelopathy or radiculopathy, lumbosacral region: Secondary | ICD-10-CM | POA: Diagnosis not present

## 2023-07-15 DIAGNOSIS — M5459 Other low back pain: Secondary | ICD-10-CM | POA: Diagnosis not present

## 2023-07-15 DIAGNOSIS — M549 Dorsalgia, unspecified: Secondary | ICD-10-CM | POA: Diagnosis not present

## 2023-07-15 DIAGNOSIS — M545 Low back pain, unspecified: Secondary | ICD-10-CM | POA: Insufficient documentation

## 2023-07-15 MED ORDER — LIDOCAINE 5 % EX PTCH: 1.0000 | MEDICATED_PATCH | Freq: Two times a day (BID) | CUTANEOUS | 0 refills | Status: AC

## 2023-07-15 MED ORDER — LIDOCAINE 5 % EX PTCH
1.0000 | MEDICATED_PATCH | CUTANEOUS | Status: DC
  Administered 2023-07-15: 1 via TRANSDERMAL
  Filled 2023-07-15: qty 1
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
## 2023-07-15 NOTE — ED Notes (Signed)
Pt gone to xray
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
## 2023-07-15 NOTE — ED Notes (Signed)
Pt declined discharge vitals. Ambulatory from department. Pt verbalizes understanding of discharge instructions. Opportunity for questioning and answers were provided. Pt discharged from ED to home.

## 2023-07-31 ENCOUNTER — Telehealth: Payer: Self-pay

## 2023-07-31 NOTE — Progress Notes (Signed)
 Transition Care Management Unsuccessful Follow-up Telephone Call  Date of discharge and from where:  [name] 12/17  Attempts:  1st Attempt  Reason for unsuccessful TCM follow-up call:  No answer/busy   Tasha Simmons  [facility], [facility] Guide, Phone: [phone] Website: [email or web address]

## 2023-08-01 ENCOUNTER — Telehealth: Payer: Self-pay

## 2023-08-01 NOTE — Progress Notes (Signed)
 Transition Care Management Unsuccessful Follow-up Telephone Call  Date of discharge and from where:  [place] 12/17  Attempts:  2nd Attempt  Reason for unsuccessful TCM follow-up call:  No answer/busy   Jon Colt [place]  [facility], [facility] Guide, Phone: [phone] Website: [email or web address]

## 2023-12-06 ENCOUNTER — Telehealth: Admitting: Family Medicine

## 2023-12-06 DIAGNOSIS — H9203 Otalgia, bilateral: Secondary | ICD-10-CM | POA: Diagnosis not present

## 2023-12-06 MED ORDER — IPRATROPIUM BROMIDE 0.03 % NA SOLN: 2.0000 | Freq: Two times a day (BID) | NASAL | 0 refills | Status: AC

## 2023-12-06 NOTE — Progress Notes (Signed)

## 2024-02-10 ENCOUNTER — Telehealth: Admitting: Physician Assistant

## 2024-02-10 DIAGNOSIS — J039 Acute tonsillitis, unspecified: Secondary | ICD-10-CM

## 2024-02-10 MED ORDER — AMOXICILLIN-POT CLAVULANATE 875-125 MG PO TABS: 1.0000 | ORAL_TABLET | Freq: Two times a day (BID) | ORAL | 0 refills | Status: AC

## 2024-02-10 NOTE — Progress Notes (Signed)
 I have spent 5 minutes in review of e-visit questionnaire, review and updating patient chart, medical decision making and response to patient.   Piedad Climes, PA-C

## 2024-02-10 NOTE — Progress Notes (Signed)
 E-Visit for Sinus Problems  We are sorry that you are not feeling well.  Here is how we plan to help!  Based on what you have shared with me it looks like you have sinusitis and tonsillitis.  Sinusitis is inflammation and infection in the sinus cavities of the head.  Based on your presentation I believe you most likely have Acute Bacterial Sinusitis.  This is an infection caused by bacteria and is treated with antibiotics. I have prescribed Augmentin  875mg /125mg  one tablet twice daily with food, for 7 days. You may use an oral decongestant such as Mucinex  D or if you have glaucoma or high blood pressure use plain Mucinex . Saline nasal spray help and can safely be used as often as needed for congestion.  If you develop worsening sinus pain, fever or notice severe headache and vision changes, or if symptoms are not better after completion of antibiotic, please schedule an appointment with a health care provider.    Sinus infections are not as easily transmitted as other respiratory infection, however we still recommend that you avoid close contact with loved ones, especially the very young and elderly.  Remember to wash your hands thoroughly throughout the day as this is the number one way to prevent the spread of infection!  Home Care: Only take medications as instructed by your medical team. Complete the entire course of an antibiotic. Do not take these medications with alcohol. A steam or ultrasonic humidifier can help congestion.  You can place a towel over your head and breathe in the steam from hot water coming from a faucet. Avoid close contacts especially the very young and the elderly. Cover your mouth when you cough or sneeze. Always remember to wash your hands.  Get Help Right Away If: You develop worsening fever or sinus pain. You develop a severe head ache or visual changes. Your symptoms persist after you have completed your treatment plan.  Make sure you Understand these  instructions. Will watch your condition. Will get help right away if you are not doing well or get worse.  Thank you for choosing an e-visit.  Your e-visit answers were reviewed by a board certified advanced clinical practitioner to complete your personal care plan. Depending upon the condition, your plan could have included both over the counter or prescription medications.  Please review your pharmacy choice. Make sure the pharmacy is open so you can pick up prescription now. If there is a problem, you may contact your provider through [employer] and have the prescription routed to another pharmacy.  Your safety is important to us . If you have drug allergies check your prescription carefully.   For the next 24 hours you can use MyChart to ask questions about today's visit, request a non-urgent call back, or ask for a work or school excuse. You will get an email in the next two days asking about your experience. I hope that your e-visit has been valuable and will speed your recovery.

## 2024-02-15 ENCOUNTER — Encounter: Payer: Self-pay | Admitting: Family Medicine
